# Patient Record
Sex: Male | Born: 1995 | Race: Black or African American | Hispanic: No | Marital: Single | Smoking: Current some day smoker
Health system: Southern US, Community
[De-identification: ages and names within clinical notes are randomized; demographics above are authoritative.]

## PROBLEM LIST (undated history)

## (undated) DIAGNOSIS — W3400XA Accidental discharge from unspecified firearms or gun, initial encounter: Secondary | ICD-10-CM

---

## 2017-09-03 ENCOUNTER — Encounter (HOSPITAL_BASED_OUTPATIENT_CLINIC_OR_DEPARTMENT_OTHER): Payer: Self-pay | Admitting: *Deleted

## 2017-09-03 ENCOUNTER — Other Ambulatory Visit: Payer: Self-pay

## 2017-09-03 ENCOUNTER — Emergency Department (HOSPITAL_BASED_OUTPATIENT_CLINIC_OR_DEPARTMENT_OTHER)
Admission: EM | Admit: 2017-09-03 | Discharge: 2017-09-03 | Disposition: A | Discharge status: Home / Self Care | Payer: Self-pay | Attending: Emergency Medicine | Admitting: Emergency Medicine

## 2017-09-03 ENCOUNTER — Emergency Department (HOSPITAL_BASED_OUTPATIENT_CLINIC_OR_DEPARTMENT_OTHER): Payer: Self-pay

## 2017-09-03 DIAGNOSIS — R0602 Shortness of breath: Secondary | ICD-10-CM | POA: Insufficient documentation

## 2017-09-03 DIAGNOSIS — Y999 Unspecified external cause status: Secondary | ICD-10-CM | POA: Insufficient documentation

## 2017-09-03 DIAGNOSIS — Y939 Activity, unspecified: Secondary | ICD-10-CM | POA: Insufficient documentation

## 2017-09-03 DIAGNOSIS — S0083XA Contusion of other part of head, initial encounter: Secondary | ICD-10-CM | POA: Insufficient documentation

## 2017-09-03 DIAGNOSIS — R1110 Vomiting, unspecified: Secondary | ICD-10-CM | POA: Insufficient documentation

## 2017-09-03 DIAGNOSIS — S20212A Contusion of left front wall of thorax, initial encounter: Secondary | ICD-10-CM | POA: Insufficient documentation

## 2017-09-03 DIAGNOSIS — Y929 Unspecified place or not applicable: Secondary | ICD-10-CM | POA: Insufficient documentation

## 2017-09-03 DIAGNOSIS — F17210 Nicotine dependence, cigarettes, uncomplicated: Secondary | ICD-10-CM | POA: Insufficient documentation

## 2017-09-03 MED ORDER — KETOROLAC TROMETHAMINE 30 MG/ML IJ SOLN
30.0000 mg | Freq: Once | INTRAMUSCULAR | Status: DC
  Filled 2017-09-03: qty 1

## 2017-09-03 MED ORDER — NAPROXEN 500 MG PO TABS: 500.0000 mg | ORAL_TABLET | Freq: Two times a day (BID) | ORAL | 0 refills | Status: DC

## 2017-09-03 NOTE — ED Notes (Signed)
Ice pack given at d/c home.  

## 2017-09-03 NOTE — ED Notes (Signed)
Patient transported to CT 
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
## 2018-05-17 ENCOUNTER — Encounter (HOSPITAL_BASED_OUTPATIENT_CLINIC_OR_DEPARTMENT_OTHER): Payer: Self-pay | Admitting: Emergency Medicine

## 2018-05-17 ENCOUNTER — Emergency Department (HOSPITAL_BASED_OUTPATIENT_CLINIC_OR_DEPARTMENT_OTHER)
Admission: EM | Admit: 2018-05-17 | Discharge: 2018-05-18 | Disposition: A | Discharge status: Home / Self Care | Payer: Self-pay | Attending: Emergency Medicine | Admitting: Emergency Medicine

## 2018-05-17 ENCOUNTER — Other Ambulatory Visit: Payer: Self-pay

## 2018-05-17 DIAGNOSIS — Z202 Contact with and (suspected) exposure to infections with a predominantly sexual mode of transmission: Secondary | ICD-10-CM

## 2018-05-17 DIAGNOSIS — F17210 Nicotine dependence, cigarettes, uncomplicated: Secondary | ICD-10-CM | POA: Insufficient documentation

## 2018-05-17 DIAGNOSIS — R55 Syncope and collapse: Secondary | ICD-10-CM

## 2018-05-17 MED ORDER — AZITHROMYCIN 250 MG PO TABS
1000.0000 mg | ORAL_TABLET | Freq: Once | ORAL | Status: AC
  Administered 2018-05-17: 1000 mg via ORAL
  Filled 2018-05-17: qty 4

## 2018-05-17 MED ORDER — METRONIDAZOLE 500 MG PO TABS
2000.0000 mg | ORAL_TABLET | Freq: Once | ORAL | Status: AC
  Administered 2018-05-17: 2000 mg via ORAL
  Filled 2018-05-17: qty 4

## 2018-05-17 MED ORDER — CEFTRIAXONE SODIUM 250 MG IJ SOLR
250.0000 mg | Freq: Once | INTRAMUSCULAR | Status: AC
  Administered 2018-05-17: 250 mg via INTRAMUSCULAR
  Filled 2018-05-17: qty 250

## 2018-05-17 NOTE — Discharge Instructions (Signed)
You have been treated today for gonorrhea, chlamydia and trichomonas.  I recommend that you avoid any sexual intercourse (anal, oral, vaginal) for 1 week after you and your partner have been treated.  We have also tested you for HIV and syphilis.  You will be contacted if these are positive.   To find a primary care or specialty doctor please call [phone] or [phone] to access "[facility] Find a Doctor Service."  You may also go on the [facility] website at [email or web address]  There are also multiple [facility], [facility], [facility] and [facility] practices throughout the [place] that are frequently accepting new patients. You may find a clinic that is close to your home and contact them.  [facility] -  [address] [place] [phone]   [facility] -  [address] [place] [id number] [phone]   [facility] [phone]  [place] [phone]
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
## 2018-05-17 NOTE — ED Provider Notes (Signed)
TIME SEEN: 11:41 PM  CHIEF COMPLAINT: STD check  HPI: Patient is a 22 year old male with no significant past medical history who presents to the emergency department for an STD check.  States that his girlfriend contacted him and stated that she tested positive for trichomonas.  He has no symptoms at this time.  No fever, abdominal pain, dysuria, hematuria, penile discharge, testicular pain or swelling.  He has had chlamydia in the past.  He states that he thinks his girlfriend stated that she was negative for "everything else".  ROS: See HPI Constitutional: no fever  Eyes: no drainage  ENT: no runny nose   Cardiovascular:  no chest pain  Resp: no SOB  GI: no vomiting GU: no dysuria Integumentary: no rash  Allergy: no hives  Musculoskeletal: no leg swelling  Neurological: no slurred speech ROS otherwise negative  PAST MEDICAL HISTORY/PAST SURGICAL HISTORY:  History reviewed. No pertinent past medical history.  MEDICATIONS:  Prior to Admission medications   Medication Sig Start Date End Date Taking? Authorizing Provider  naproxen (NAPROSYN) 500 MG tablet Take 1 tablet (500 mg total) by mouth 2 (two) times daily. 09/03/17   Horton, Mayer Maskerourtney F, MD    ALLERGIES:  No Known Allergies  SOCIAL HISTORY:  Social History   Tobacco Use  . Smoking status: Current Every Day Smoker  . Smokeless tobacco: Never Used  Substance Use Topics  . Alcohol use: Yes    Comment: occasional     FAMILY HISTORY: History reviewed. No pertinent family history.  EXAM: BP 123/85 (BP Location: Left Arm)   Pulse 69   Temp 98.4 F (36.9 C) (Oral)   Resp 18   Ht 6' (1.829 m)   Wt 72.6 kg   SpO2 98%   BMI 21.70 kg/m  CONSTITUTIONAL: Alert and oriented and responds appropriately to questions. Well-appearing; well-nourished HEAD: Normocephalic EYES: Conjunctivae clear, pupils appear equal, EOMI ENT: normal nose; moist mucous membranes NECK: Supple, no meningismus, no nuchal rigidity, no LAD   CARD: RRR; S1 and S2 appreciated; no murmurs, no clicks, no rubs, no gallops RESP: Normal chest excursion without splinting or tachypnea; breath sounds clear and equal bilaterally; no wheezes, no rhonchi, no rales, no hypoxia or respiratory distress, speaking full sentences ABD/GI: Normal bowel sounds; non-distended; soft, non-tender, no rebound, no guarding, no peritoneal signs, no hepatosplenomegaly GU:  Patient declines BACK:  The back appears normal and is non-tender to palpation, there is no CVA tenderness EXT: Normal ROM in all joints; non-tender to palpation; no edema; normal capillary refill; no cyanosis, no calf tenderness or swelling    SKIN: Normal color for age and race; warm; no rash NEURO: Moves all extremities equally PSYCH: The patient's mood and manner are appropriate. Grooming and personal hygiene are appropriate.  MEDICAL DECISION MAKING: Patient here for STD screening, treatment.  Girlfriend positive for trichomonas.  He is asymptomatic.  He agrees to HIV, syphilis testing today.  We will treat for gonorrhea, chlamydia and trichomonas today given we do not have access to girlfriends results.  Patient comfortable with this plan.  We will give him outpatient follow-up.  ED PROGRESS: Patient had brief syncopal event after receiving IM ceftriaxone.  No preceding symptoms.  No seizure-like activity.  EKG shows no arrhythmia, interval abnormalities or ischemia.  Blood glucose is normal.  Patient's urine shows no sign of infection other than some trace leukocytes and rare bacteria.  He has been monitored in the ED after has a syncopal event has been able to  ambulate, eat and drink and reports feeling better.  Will discharge home.   At this time, I do not feel there is any life-threatening condition present. I have reviewed and discussed all results (EKG, imaging, lab, urine as appropriate) and exam findings with patient/family. I have reviewed nursing notes and appropriate previous  records.  I feel the patient is safe to be discharged home without further emergent workup and can continue workup as an outpatient as needed. Discussed usual and customary return precautions. Patient/family verbalize understanding and are comfortable with this plan.  Outpatient follow-up has been provided if needed. All questions have been answered.      EKG Interpretation  Date/Time:  Saturday May 18 2018 00:02:39 EST Ventricular Rate:  68 PR Interval:    QRS Duration: 92 QT Interval:  377 QTC Calculation: 401 R Axis:   78 Text Interpretation:  Sinus rhythm No old tracing to compare Confirmed by Sevastian Witczak, Baxter Hire [id number]) on 05/18/2018 12:11:25 AM         Kord Monette, Layla Maw, DO 05/18/18 6045

## 2018-05-18 LAB — URINALYSIS, ROUTINE W REFLEX MICROSCOPIC
Bilirubin Urine: NEGATIVE
Glucose, UA: NEGATIVE mg/dL
Hgb urine dipstick: NEGATIVE
Ketones, ur: NEGATIVE mg/dL
Nitrite: NEGATIVE
Protein, ur: NEGATIVE mg/dL
Specific Gravity, Urine: 1.02 (ref 1.005–1.030)
pH: 6 (ref 5.0–8.0)

## 2018-05-18 LAB — URINALYSIS, MICROSCOPIC (REFLEX)

## 2018-05-18 LAB — CBG MONITORING, ED: Glucose-Capillary: 84 mg/dL (ref 70–99)

## 2018-05-18 NOTE — ED Notes (Signed)
20

## 2018-05-18 NOTE — ED Notes (Signed)
Blood sugar CBG 84

## 2018-05-19 LAB — RPR: RPR Ser Ql: NONREACTIVE

## 2018-05-19 LAB — HIV ANTIBODY (ROUTINE TESTING W REFLEX): HIV Screen 4th Generation wRfx: NONREACTIVE

## 2018-11-10 ENCOUNTER — Other Ambulatory Visit: Payer: Self-pay

## 2018-11-10 ENCOUNTER — Observation Stay (HOSPITAL_COMMUNITY): Payer: No Typology Code available for payment source

## 2018-11-10 ENCOUNTER — Emergency Department (HOSPITAL_COMMUNITY): Payer: No Typology Code available for payment source

## 2018-11-10 ENCOUNTER — Observation Stay (HOSPITAL_COMMUNITY)
Admission: EM | Admit: 2018-11-10 | Discharge: 2018-11-10 | Disposition: A | Discharge status: Home / Self Care | Payer: No Typology Code available for payment source | Attending: Emergency Medicine | Admitting: Emergency Medicine

## 2018-11-10 ENCOUNTER — Encounter (HOSPITAL_COMMUNITY): Payer: Self-pay | Admitting: Emergency Medicine

## 2018-11-10 DIAGNOSIS — F17200 Nicotine dependence, unspecified, uncomplicated: Secondary | ICD-10-CM | POA: Insufficient documentation

## 2018-11-10 DIAGNOSIS — S27339A Laceration of lung, unspecified, initial encounter: Secondary | ICD-10-CM | POA: Diagnosis present

## 2018-11-10 DIAGNOSIS — Y92410 Unspecified street and highway as the place of occurrence of the external cause: Secondary | ICD-10-CM | POA: Diagnosis not present

## 2018-11-10 DIAGNOSIS — Z1159 Encounter for screening for other viral diseases: Secondary | ICD-10-CM | POA: Diagnosis not present

## 2018-11-10 DIAGNOSIS — S82111A Displaced fracture of right tibial spine, initial encounter for closed fracture: Secondary | ICD-10-CM | POA: Diagnosis not present

## 2018-11-10 DIAGNOSIS — S270XXA Traumatic pneumothorax, initial encounter: Secondary | ICD-10-CM | POA: Diagnosis present

## 2018-11-10 DIAGNOSIS — M25561 Pain in right knee: Secondary | ICD-10-CM | POA: Insufficient documentation

## 2018-11-10 DIAGNOSIS — S27331A Laceration of lung, unilateral, initial encounter: Principal | ICD-10-CM | POA: Insufficient documentation

## 2018-11-10 DIAGNOSIS — S27322A Contusion of lung, bilateral, initial encounter: Secondary | ICD-10-CM

## 2018-11-10 DIAGNOSIS — S2243XA Multiple fractures of ribs, bilateral, initial encounter for closed fracture: Secondary | ICD-10-CM | POA: Diagnosis not present

## 2018-11-10 DIAGNOSIS — Z791 Long term (current) use of non-steroidal anti-inflammatories (NSAID): Secondary | ICD-10-CM | POA: Insufficient documentation

## 2018-11-10 LAB — URINALYSIS, ROUTINE W REFLEX MICROSCOPIC
Bacteria, UA: NONE SEEN
Bilirubin Urine: NEGATIVE
Glucose, UA: NEGATIVE mg/dL
Ketones, ur: NEGATIVE mg/dL
Leukocytes,Ua: NEGATIVE
Nitrite: NEGATIVE
Protein, ur: 100 mg/dL — AB
Specific Gravity, Urine: 1.029 (ref 1.005–1.030)
pH: 5 (ref 5.0–8.0)

## 2018-11-10 LAB — TROPONIN I: Troponin I: 0.06 ng/mL (ref <0.03)

## 2018-11-10 LAB — SARS CORONAVIRUS 2 BY RT PCR (HOSPITAL ORDER, PERFORMED IN ~~LOC~~ HOSPITAL LAB): SARS Coronavirus 2: NEGATIVE

## 2018-11-10 LAB — COMPREHENSIVE METABOLIC PANEL
ALT: 44 U/L (ref 0–44)
AST: 108 U/L — ABNORMAL HIGH (ref 15–41)
Albumin: 4.7 g/dL (ref 3.5–5.0)
Alkaline Phosphatase: 89 U/L (ref 38–126)
Anion gap: 14 (ref 5–15)
BUN: 18 mg/dL (ref 6–20)
CO2: 22 mmol/L (ref 22–32)
Calcium: 9.1 mg/dL (ref 8.9–10.3)
Chloride: 102 mmol/L (ref 98–111)
Creatinine, Ser: 1.53 mg/dL — ABNORMAL HIGH (ref 0.61–1.24)
GFR calc Af Amer: >60 mL/min (ref >60)
GFR calc non Af Amer: >60 mL/min (ref >60)
Glucose, Bld: 145 mg/dL — ABNORMAL HIGH (ref 70–99)
Potassium: 3.1 mmol/L — ABNORMAL LOW (ref 3.5–5.1)
Sodium: 138 mmol/L (ref 135–145)
Total Bilirubin: 0.8 mg/dL (ref 0.3–1.2)
Total Protein: 7 g/dL (ref 6.5–8.1)

## 2018-11-10 LAB — CBC
HCT: 41.6 % (ref 39.0–52.0)
Hemoglobin: 14.2 g/dL (ref 13.0–17.0)
MCH: 31.1 pg (ref 26.0–34.0)
MCHC: 34.1 g/dL (ref 30.0–36.0)
MCV: 91 fL (ref 80.0–100.0)
Platelets: 190 10*3/uL (ref 150–400)
RBC: 4.57 MIL/uL (ref 4.22–5.81)
RDW: 11.9 % (ref 11.5–15.5)
WBC: 10.5 10*3/uL (ref 4.0–10.5)
nRBC: 0 % (ref 0.0–0.2)

## 2018-11-10 LAB — PROTIME-INR
INR: 1.1 (ref 0.8–1.2)
Prothrombin Time: 13.8 seconds (ref 11.4–15.2)

## 2018-11-10 LAB — SAMPLE TO BLOOD BANK

## 2018-11-10 LAB — LACTIC ACID, PLASMA: Lactic Acid, Venous: 4.1 mmol/L (ref 0.5–1.9)

## 2018-11-10 LAB — ETHANOL: Alcohol, Ethyl (B): 123 mg/dL — ABNORMAL HIGH (ref <10)

## 2018-11-10 MED ORDER — SODIUM CHLORIDE 0.9 % IV BOLUS
1000.0000 mL | Freq: Once | INTRAVENOUS | Status: AC
  Administered 2018-11-10: 04:00:00 1000 mL via INTRAVENOUS

## 2018-11-10 MED ORDER — METOPROLOL TARTRATE 5 MG/5ML IV SOLN: 5.0000 mg | Freq: Four times a day (QID) | INTRAVENOUS | Status: DC | PRN

## 2018-11-10 MED ORDER — SODIUM CHLORIDE 0.9 % IV BOLUS
1000.0000 mL | Freq: Once | INTRAVENOUS | Status: AC
  Administered 2018-11-10: 1000 mL via INTRAVENOUS

## 2018-11-10 MED ORDER — HYDRALAZINE HCL 20 MG/ML IJ SOLN: 10.0000 mg | INTRAMUSCULAR | Status: DC | PRN

## 2018-11-10 MED ORDER — OXYCODONE HCL 5 MG PO TABS
5.0000 mg | ORAL_TABLET | ORAL | Status: DC | PRN
  Administered 2018-11-10: 5 mg via ORAL
  Filled 2018-11-10: qty 1

## 2018-11-10 MED ORDER — ACETAMINOPHEN 325 MG PO TABS: 650.0000 mg | ORAL_TABLET | ORAL | Status: DC | PRN

## 2018-11-10 MED ORDER — SODIUM CHLORIDE 0.9 % IV SOLN
INTRAVENOUS | Status: DC
  Administered 2018-11-10: 11:00:00 via INTRAVENOUS

## 2018-11-10 MED ORDER — MORPHINE SULFATE (PF) 2 MG/ML IV SOLN
2.0000 mg | INTRAVENOUS | Status: DC | PRN
  Administered 2018-11-10 (×2): 2 mg via INTRAVENOUS
  Filled 2018-11-10: qty 2

## 2018-11-10 MED ORDER — ONDANSETRON HCL 4 MG/2ML IJ SOLN
4.0000 mg | Freq: Once | INTRAMUSCULAR | Status: AC
  Administered 2018-11-10: 04:00:00 4 mg via INTRAVENOUS
  Filled 2018-11-10: qty 2

## 2018-11-10 MED ORDER — HYDROMORPHONE HCL 1 MG/ML IJ SOLN: 1.0000 mg | Freq: Once | INTRAMUSCULAR | Status: DC

## 2018-11-10 MED ORDER — LORAZEPAM 2 MG/ML IJ SOLN: 0.5000 mg | Freq: Once | INTRAMUSCULAR | Status: DC

## 2018-11-10 MED ORDER — FENTANYL CITRATE (PF) 100 MCG/2ML IJ SOLN
100.0000 ug | Freq: Once | INTRAMUSCULAR | Status: AC
  Administered 2018-11-10: 05:00:00 100 ug via INTRAVENOUS
  Filled 2018-11-10: qty 2

## 2018-11-10 MED ORDER — OXYCODONE HCL 5 MG PO TABS: 5.0000 mg | ORAL_TABLET | Freq: Four times a day (QID) | ORAL | 0 refills | Status: DC | PRN

## 2018-11-10 MED ORDER — IBUPROFEN 800 MG PO TABS: 800.0000 mg | ORAL_TABLET | Freq: Three times a day (TID) | ORAL | 0 refills | Status: AC | PRN

## 2018-11-10 MED ORDER — ONDANSETRON HCL 4 MG/2ML IJ SOLN: 4.0000 mg | Freq: Four times a day (QID) | INTRAMUSCULAR | Status: DC | PRN

## 2018-11-10 MED ORDER — ONDANSETRON 4 MG PO TBDP: 4.0000 mg | ORAL_TABLET | Freq: Four times a day (QID) | ORAL | Status: DC | PRN

## 2018-11-10 MED ORDER — IOHEXOL 300 MG/ML  SOLN
100.0000 mL | Freq: Once | INTRAMUSCULAR | Status: AC | PRN
  Administered 2018-11-10: 100 mL via INTRAVENOUS
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
## 2018-11-10 NOTE — ED Notes (Signed)
Update mom - Rodney Baker [phone]. Whenever possible.

## 2018-11-10 NOTE — ED Notes (Signed)
CRITICAL VALUE ALERT  Critical Value:  Lactic 4.1 Date & Time Notied:  11/10/2018 0351  Provider Notified: Rancour
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
## 2018-11-10 NOTE — Care Management (Signed)
3n1 and RW ordered to be delivered to room prior to d/c per therapy recommendations.
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
## 2018-11-10 NOTE — ED Notes (Signed)
Dr. Sheliah Hatch in to examine pt

## 2018-11-10 NOTE — ED Notes (Signed)
c collar placed on pt's neck on arrival to ED.
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
## 2018-11-10 NOTE — ED Notes (Signed)
Recollect blue top tube per main lab

## 2018-11-10 NOTE — ED Notes (Signed)
CRITICAL VALUE ALERT  Critical Value:  Trop 0.06 Date & Time Notied:  11/10/2018 at 6am Provider Notified: Rancour
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
## 2018-11-19 ENCOUNTER — Emergency Department (HOSPITAL_COMMUNITY)
Admission: EM | Admit: 2018-11-19 | Discharge: 2018-11-20 | Discharge status: Against Medical Advice | Payer: Self-pay | Attending: Emergency Medicine | Admitting: Emergency Medicine

## 2018-11-19 ENCOUNTER — Emergency Department (HOSPITAL_COMMUNITY): Payer: Self-pay

## 2018-11-19 ENCOUNTER — Other Ambulatory Visit: Payer: Self-pay

## 2018-11-19 DIAGNOSIS — Z5321 Procedure and treatment not carried out due to patient leaving prior to being seen by health care provider: Secondary | ICD-10-CM | POA: Insufficient documentation

## 2018-11-19 LAB — CBC
HCT: 41 % (ref 39.0–52.0)
Hemoglobin: 13.8 g/dL (ref 13.0–17.0)
MCH: 31.2 pg (ref 26.0–34.0)
MCHC: 33.7 g/dL (ref 30.0–36.0)
MCV: 92.8 fL (ref 80.0–100.0)
Platelets: 208 10*3/uL (ref 150–400)
RBC: 4.42 MIL/uL (ref 4.22–5.81)
RDW: 11.7 % (ref 11.5–15.5)
WBC: 8.3 10*3/uL (ref 4.0–10.5)
nRBC: 0 % (ref 0.0–0.2)

## 2018-11-19 LAB — BASIC METABOLIC PANEL
Anion gap: 10 (ref 5–15)
BUN: 19 mg/dL (ref 6–20)
CO2: 29 mmol/L (ref 22–32)
Calcium: 10 mg/dL (ref 8.9–10.3)
Chloride: 101 mmol/L (ref 98–111)
Creatinine, Ser: 1.39 mg/dL — ABNORMAL HIGH (ref 0.61–1.24)
GFR calc Af Amer: >60 mL/min (ref >60)
GFR calc non Af Amer: >60 mL/min (ref >60)
Glucose, Bld: 120 mg/dL — ABNORMAL HIGH (ref 70–99)
Potassium: 3.6 mmol/L (ref 3.5–5.1)
Sodium: 140 mmol/L (ref 135–145)
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
## 2018-11-20 ENCOUNTER — Other Ambulatory Visit: Payer: Self-pay

## 2018-11-20 ENCOUNTER — Encounter (HOSPITAL_COMMUNITY): Payer: Self-pay | Admitting: Emergency Medicine

## 2018-11-20 ENCOUNTER — Emergency Department (HOSPITAL_COMMUNITY): Payer: No Typology Code available for payment source

## 2018-11-20 ENCOUNTER — Emergency Department (HOSPITAL_COMMUNITY)
Admission: EM | Admit: 2018-11-20 | Discharge: 2018-11-20 | Disposition: A | Discharge status: Home / Self Care | Payer: No Typology Code available for payment source | Attending: Emergency Medicine | Admitting: Emergency Medicine

## 2018-11-20 DIAGNOSIS — S2242XD Multiple fractures of ribs, left side, subsequent encounter for fracture with routine healing: Secondary | ICD-10-CM | POA: Insufficient documentation

## 2018-11-20 DIAGNOSIS — M25562 Pain in left knee: Secondary | ICD-10-CM | POA: Insufficient documentation

## 2018-11-20 DIAGNOSIS — M25561 Pain in right knee: Secondary | ICD-10-CM | POA: Diagnosis not present

## 2018-11-20 DIAGNOSIS — R0789 Other chest pain: Secondary | ICD-10-CM

## 2018-11-20 DIAGNOSIS — F17200 Nicotine dependence, unspecified, uncomplicated: Secondary | ICD-10-CM | POA: Insufficient documentation

## 2018-11-20 MED ORDER — OXYCODONE-ACETAMINOPHEN 5-325 MG PO TABS
1.0000 | ORAL_TABLET | Freq: Once | ORAL | Status: AC
  Administered 2018-11-20: 13:00:00 1 via ORAL
  Filled 2018-11-20: qty 1

## 2018-11-20 MED ORDER — OXYCODONE-ACETAMINOPHEN 5-325 MG PO TABS: 1.0000 | ORAL_TABLET | Freq: Four times a day (QID) | ORAL | 0 refills | Status: DC | PRN
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
## 2018-11-20 NOTE — Discharge Instructions (Addendum)
Please read attached information. If you experience any new or worsening signs or symptoms please return to the emergency room for evaluation. Please follow-up with your primary care provider or specialist as discussed. Please use medication prescribed only as directed and discontinue taking if you have any concerning signs or symptoms.   °

## 2018-11-20 NOTE — ED Notes (Signed)
No answer
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
## 2018-12-25 ENCOUNTER — Other Ambulatory Visit: Payer: Self-pay

## 2018-12-25 ENCOUNTER — Encounter (HOSPITAL_COMMUNITY): Payer: Self-pay

## 2018-12-25 ENCOUNTER — Emergency Department (HOSPITAL_COMMUNITY): Payer: No Typology Code available for payment source

## 2018-12-25 ENCOUNTER — Emergency Department (HOSPITAL_COMMUNITY)
Admission: EM | Admit: 2018-12-25 | Discharge: 2018-12-25 | Disposition: A | Discharge status: Home / Self Care | Payer: No Typology Code available for payment source | Attending: Emergency Medicine | Admitting: Emergency Medicine

## 2018-12-25 DIAGNOSIS — Z79899 Other long term (current) drug therapy: Secondary | ICD-10-CM | POA: Diagnosis not present

## 2018-12-25 DIAGNOSIS — M7918 Myalgia, other site: Secondary | ICD-10-CM | POA: Diagnosis present

## 2018-12-25 DIAGNOSIS — F17210 Nicotine dependence, cigarettes, uncomplicated: Secondary | ICD-10-CM | POA: Insufficient documentation

## 2018-12-25 MED ORDER — METHOCARBAMOL 500 MG PO TABS: 500.0000 mg | ORAL_TABLET | Freq: Two times a day (BID) | ORAL | 0 refills | Status: DC

## 2018-12-25 MED ORDER — LIDOCAINE 5 % EX PTCH: 1.0000 | MEDICATED_PATCH | CUTANEOUS | 0 refills | Status: DC
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
## 2018-12-25 NOTE — Discharge Instructions (Addendum)
Expect your soreness to increase over the next 2-3 days. Take it easy, but do not lay around too much as this may make any stiffness worse.  Antiinflammatory medications: Take 600 mg of ibuprofen every 6 hours or 440 mg (over the counter dose) to 500 mg (prescription dose) of naproxen every 12 hours for the next 3 days. After this time, these medications may be used as needed for pain. Take these medications with food to avoid upset stomach. Choose only one of these medications, do not take them together. Acetaminophen (generic for Tylenol): Should you continue to have additional pain while taking the ibuprofen or naproxen, you may add in acetaminophen as needed. Your daily total maximum amount of acetaminophen from all sources should be limited to 4000mg /day for persons without liver problems, or 2000mg /day for those with liver problems. Methocarbamol: Methocarbamol (generic for Robaxin) is a muscle relaxer and can help relieve stiff muscles or muscle spasms.  Do not drive or perform other dangerous activities while taking this medication as it can cause drowsiness as well as changes in reaction time and judgement. Lidocaine patches: These are available via either prescription or over-the-counter. The over-the-counter option may be more economical one and are likely just as effective. There are multiple over-the-counter brands, such as Salonpas. Exercises: Be sure to perform the attached exercises starting with three times a week and working up to performing them daily. This is an essential part of preventing long term problems.  Follow up: Follow up with a primary care provider for any future management of these complaints. Be sure to follow up within 7-10 days. Return: Return to the ED should symptoms worsen.  For prescription assistance, may try using prescription discount sites or apps, such as goodrx.com

## 2020-07-07 ENCOUNTER — Other Ambulatory Visit: Payer: Self-pay

## 2020-07-07 ENCOUNTER — Emergency Department (HOSPITAL_COMMUNITY)
Admission: EM | Admit: 2020-07-07 | Discharge: 2020-07-08 | Discharge status: Against Medical Advice | Payer: No Typology Code available for payment source

## 2020-07-07 NOTE — ED Notes (Signed)
Pt called x 3  No answer. 

## 2020-07-08 ENCOUNTER — Emergency Department (HOSPITAL_BASED_OUTPATIENT_CLINIC_OR_DEPARTMENT_OTHER)
Admission: EM | Admit: 2020-07-08 | Discharge: 2020-07-08 | Disposition: A | Discharge status: Home / Self Care | Payer: Self-pay | Attending: Emergency Medicine | Admitting: Emergency Medicine

## 2020-07-08 ENCOUNTER — Encounter (HOSPITAL_BASED_OUTPATIENT_CLINIC_OR_DEPARTMENT_OTHER): Payer: Self-pay

## 2020-07-08 DIAGNOSIS — F17210 Nicotine dependence, cigarettes, uncomplicated: Secondary | ICD-10-CM | POA: Insufficient documentation

## 2020-07-08 DIAGNOSIS — L0291 Cutaneous abscess, unspecified: Secondary | ICD-10-CM

## 2020-07-08 DIAGNOSIS — L02411 Cutaneous abscess of right axilla: Secondary | ICD-10-CM | POA: Insufficient documentation

## 2020-07-08 MED ORDER — SULFAMETHOXAZOLE-TRIMETHOPRIM 800-160 MG PO TABS: 1.0000 | ORAL_TABLET | Freq: Two times a day (BID) | ORAL | 0 refills | Status: AC

## 2020-07-08 MED ORDER — OXYCODONE-ACETAMINOPHEN 5-325 MG PO TABS
1.0000 | ORAL_TABLET | Freq: Once | ORAL | Status: AC
  Administered 2020-07-08: 1 via ORAL
  Filled 2020-07-08: qty 1

## 2020-07-08 MED ORDER — OXYCODONE-ACETAMINOPHEN 5-325 MG PO TABS: 1.0000 | ORAL_TABLET | Freq: Three times a day (TID) | ORAL | 0 refills | Status: DC | PRN

## 2020-07-08 MED ORDER — LIDOCAINE HCL (PF) 1 % IJ SOLN
5.0000 mL | Freq: Once | INTRAMUSCULAR | Status: AC
  Administered 2020-07-08: 5 mL via INTRADERMAL
  Filled 2020-07-08: qty 5
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
## 2020-07-08 NOTE — Discharge Instructions (Addendum)
Keep the wound clean and dry and lightly covered to allow it to drain.  Get a warm soapy bath filled with Dial antibacterial soap and soak your armpit at least once a day.  Take the antibiotics as prescribed.  Use Tylenol Motrin as described below to maximize pain control.  Use the Percocet for breakthrough pain.  You can take 600 mg of ibuprofen every 6 hours, you can take 1000 mg of Tylenol every 6 hours, you can alternate these every 3 or you can take them together.

## 2020-07-08 NOTE — ED Notes (Signed)
Patient with abscess to right axilla, swollen, tender, no drainage.

## 2020-07-08 NOTE — ED Triage Notes (Signed)
Pt reports boil under R arm x5 days.
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
## 2021-06-26 ENCOUNTER — Encounter (HOSPITAL_BASED_OUTPATIENT_CLINIC_OR_DEPARTMENT_OTHER): Payer: Self-pay | Admitting: Emergency Medicine

## 2021-06-26 ENCOUNTER — Other Ambulatory Visit: Payer: Self-pay

## 2021-06-26 ENCOUNTER — Emergency Department (HOSPITAL_BASED_OUTPATIENT_CLINIC_OR_DEPARTMENT_OTHER)
Admission: EM | Admit: 2021-06-26 | Discharge: 2021-06-27 | Disposition: A | Discharge status: Home / Self Care | Payer: Self-pay | Attending: Emergency Medicine | Admitting: Emergency Medicine

## 2021-06-26 DIAGNOSIS — R112 Nausea with vomiting, unspecified: Secondary | ICD-10-CM | POA: Insufficient documentation

## 2021-06-26 DIAGNOSIS — R197 Diarrhea, unspecified: Secondary | ICD-10-CM | POA: Insufficient documentation

## 2021-06-26 DIAGNOSIS — R1031 Right lower quadrant pain: Secondary | ICD-10-CM | POA: Insufficient documentation

## 2021-06-26 LAB — CBC WITH DIFFERENTIAL/PLATELET
Abs Immature Granulocytes: 0.08 10*3/uL — ABNORMAL HIGH (ref 0.00–0.07)
Basophils Absolute: 0 10*3/uL (ref 0.0–0.1)
Basophils Relative: 0 %
Eosinophils Absolute: 0 10*3/uL (ref 0.0–0.5)
Eosinophils Relative: 0 %
HCT: 45.3 % (ref 39.0–52.0)
Hemoglobin: 15.9 g/dL (ref 13.0–17.0)
Immature Granulocytes: 0 %
Lymphocytes Relative: 4 %
Lymphs Abs: 0.7 10*3/uL (ref 0.7–4.0)
MCH: 32.2 pg (ref 26.0–34.0)
MCHC: 35.1 g/dL (ref 30.0–36.0)
MCV: 91.7 fL (ref 80.0–100.0)
Monocytes Absolute: 0.4 10*3/uL (ref 0.1–1.0)
Monocytes Relative: 2 %
Neutro Abs: 18.3 10*3/uL — ABNORMAL HIGH (ref 1.7–7.7)
Neutrophils Relative %: 94 %
Platelets: 192 10*3/uL (ref 150–400)
RBC: 4.94 MIL/uL (ref 4.22–5.81)
RDW: 11.6 % (ref 11.5–15.5)
WBC: 19.5 10*3/uL — ABNORMAL HIGH (ref 4.0–10.5)
nRBC: 0 % (ref 0.0–0.2)

## 2021-06-26 MED ORDER — ALUM & MAG HYDROXIDE-SIMETH 200-200-20 MG/5ML PO SUSP
30.0000 mL | Freq: Once | ORAL | Status: AC
  Administered 2021-06-26: 30 mL via ORAL
  Filled 2021-06-26: qty 30

## 2021-06-26 MED ORDER — ONDANSETRON HCL 4 MG/2ML IJ SOLN
4.0000 mg | Freq: Once | INTRAMUSCULAR | Status: AC
  Administered 2021-06-26: 4 mg via INTRAVENOUS
  Filled 2021-06-26: qty 2

## 2021-06-26 MED ORDER — LIDOCAINE VISCOUS HCL 2 % MT SOLN
15.0000 mL | Freq: Once | OROMUCOSAL | Status: AC
  Administered 2021-06-26: 15 mL via ORAL
  Filled 2021-06-26: qty 15

## 2021-06-26 MED ORDER — LACTATED RINGERS IV BOLUS
1000.0000 mL | Freq: Once | INTRAVENOUS | Status: AC
  Administered 2021-06-26: 1000 mL via INTRAVENOUS

## 2021-06-26 NOTE — ED Provider Notes (Signed)
[facility] EMERGENCY DEPARTMENT Provider Note   CSN: [id number] Arrival date & time: 06/26/21  2252     History  Chief Complaint  Patient presents with   Vomiting    Rodney Baker is a 26 y.o. male.  Patient presents with vomiting and diarrhea after drinking alcohol heavily last night.  Reports he drank "1/5 of Hennessy". He felt well prior to this.  States he has been having vomiting all day long about 10 times and that has been nonbilious and nonbloody.  Having 3 loose stools as well.  Stools have been nonbloody.  Denies abdominal pain.  Denies fever.  Denies chest pain or shortness of breath.  Denies any travel or sick contacts.  States before last night he has not had any alcohol for greater than 1 month.  Does use tobacco and marijuana on a regular basis.  No other drug use.  No chest pain or shortness of breath.  Does have a sore throat from vomiting. No previous history of GI issues.  The history is provided by the patient.      Home Medications Prior to Admission medications   Medication Sig Start Date End Date Taking? Authorizing Provider  ibuprofen (ADVIL) 800 MG tablet Take 1 tablet (800 mg total) by mouth every 8 (eight) hours as needed. 11/10/18   Kinsinger, Arta Bruce, MD  lidocaine (LIDODERM) 5 % Place 1 patch onto the skin daily. Remove & Discard patch within 12 hours or as directed by MD 12/25/18   Joy, Shawn C, PA-C  methocarbamol (ROBAXIN) 500 MG tablet Take 1 tablet (500 mg total) by mouth 2 (two) times daily. 12/25/18   Joy, Shawn C, PA-C  oxyCODONE-acetaminophen (PERCOCET/ROXICET) 5-325 MG tablet Take 1 tablet by mouth every 6 (six) hours as needed. 11/20/18   Hedges, Dellis Filbert, PA-C  oxyCODONE-acetaminophen (PERCOCET/ROXICET) 5-325 MG tablet Take 1 tablet by mouth every 8 (eight) hours as needed for up to 9 doses for severe pain. 07/08/20   Breck Coons, MD      Allergies    Patient has no known allergies.    Review of Systems   Review of Systems   Constitutional:  Positive for activity change and appetite change. Negative for fever.  HENT:  Negative for congestion and rhinorrhea.   Respiratory:  Negative for cough, chest tightness and shortness of breath.   Cardiovascular:  Negative for chest pain.  Gastrointestinal:  Positive for diarrhea, nausea and vomiting. Negative for abdominal pain.  Genitourinary:  Negative for dysuria and hematuria.  Musculoskeletal:  Negative for arthralgias and myalgias.  Skin:  Negative for rash.  Neurological:  Negative for dizziness, weakness and headaches.   all other systems are negative except as noted in the HPI and PMH.   Physical Exam Updated Vital Signs BP 133/84 (BP Location: Right Arm)    Pulse 70    Temp 97.6 F (36.4 C) (Oral)    Resp 18    Ht 6' (1.829 m)    Wt 74.8 kg    SpO2 100%    BMI 22.38 kg/m  Physical Exam Vitals and nursing note reviewed.  Constitutional:      General: He is not in acute distress.    Appearance: Normal appearance. He is well-developed and normal weight. He is not ill-appearing.  HENT:     Head: Normocephalic and atraumatic.     Mouth/Throat:     Pharynx: No oropharyngeal exudate.  Eyes:     Conjunctiva/sclera: Conjunctivae normal.  Pupils: Pupils are equal, round, and reactive to light.  Neck:     Comments: No meningismus. Cardiovascular:     Rate and Rhythm: Normal rate and regular rhythm.     Heart sounds: Normal heart sounds. No murmur heard. Pulmonary:     Effort: Pulmonary effort is normal. No respiratory distress.     Breath sounds: Normal breath sounds.  Abdominal:     Palpations: Abdomen is soft.     Tenderness: There is abdominal tenderness. There is no guarding or rebound.     Comments: Mild diffuse tenderness  Musculoskeletal:        General: No tenderness. Normal range of motion.     Cervical back: Normal range of motion and neck supple.  Skin:    General: Skin is warm.  Neurological:     General: No focal deficit present.      Mental Status: He is alert and oriented to person, place, and time. Mental status is at baseline.     Cranial Nerves: No cranial nerve deficit.     Motor: No abnormal muscle tone.     Coordination: Coordination normal.     Comments:  5/5 strength throughout. CN 2-12 intact.Equal grip strength.   Psychiatric:        Behavior: Behavior normal.    ED Results / Procedures / Treatments   Labs (all labs ordered are listed, but only abnormal results are displayed) Labs Reviewed  URINALYSIS, ROUTINE W REFLEX MICROSCOPIC - Abnormal; Notable for the following components:      Result Value   Glucose, UA 250 (*)    Ketones, ur 15 (*)    All other components within normal limits  CBC WITH DIFFERENTIAL/PLATELET - Abnormal; Notable for the following components:   WBC 19.5 (*)    Neutro Abs 18.3 (*)    Abs Immature Granulocytes 0.08 (*)    All other components within normal limits  COMPREHENSIVE METABOLIC PANEL - Abnormal; Notable for the following components:   BUN 23 (*)    Creatinine, Ser 1.31 (*)    Total Protein 8.7 (*)    Albumin 5.4 (*)    Anion gap 16 (*)    All other components within normal limits  LIPASE, BLOOD    EKG None  Radiology CT ABDOMEN PELVIS W CONTRAST  Result Date: 06/27/2021 CLINICAL DATA:  Nausea vomiting. Right lower quadrant abdominal pain. EXAM: CT ABDOMEN AND PELVIS WITH CONTRAST TECHNIQUE: Multidetector CT imaging of the abdomen and pelvis was performed using the standard protocol following bolus administration of intravenous contrast. CONTRAST:  138mL OMNIPAQUE IOHEXOL 300 MG/ML  SOLN COMPARISON:  None. FINDINGS: Lower chest: The visualized lung bases are clear. No intra-abdominal free air or free fluid. Hepatobiliary: No focal liver abnormality is seen. No gallstones, gallbladder wall thickening, or biliary dilatation. Pancreas: Unremarkable. No pancreatic ductal dilatation or surrounding inflammatory changes. Spleen: Normal in size without focal abnormality.  Adrenals/Urinary Tract: Adrenal glands are unremarkable. Kidneys are normal, without renal calculi, focal lesion, or hydronephrosis. Bladder is unremarkable. Stomach/Bowel: Several normal caliber fluid-filled loops of small bowel may be physiologic or represent mild enteritis. Clinical correlation is recommended. There is no bowel obstruction. The appendix is normal. Vascular/Lymphatic: The abdominal aorta and IVC unremarkable. No portal venous gas. There is no adenopathy. Reproductive: The prostate and seminal vesicles are grossly unremarkable. No pelvic mass. Other: None Musculoskeletal: No acute or significant osseous findings. IMPRESSION: Several normal caliber fluid-filled loops of small bowel may be physiologic or represent mild enteritis. No  bowel obstruction. Normal appendix. Electronically Signed   By: Anner Crete M.D.   On: 06/27/2021 00:28    Procedures Procedures    Medications Ordered in ED Medications  lactated ringers bolus 1,000 mL (has no administration in time range)  ondansetron (ZOFRAN) injection 4 mg (has no administration in time range)  alum & mag hydroxide-simeth (MAALOX/MYLANTA) 200-200-20 MG/5ML suspension 30 mL (has no administration in time range)    And  lidocaine (XYLOCAINE) 2 % viscous mouth solution 15 mL (has no administration in time range)    ED Course/ Medical Decision Making/ A&P                           Medical Decision Making Vomiting and diarrhea since drinking alcohol heavily last night.  Vital stable, no distress, abdomen soft without peritoneal signs.  Patient given fluids and antiemetics. WBC 19, unclear etiology. No significant abdominal pain.  Creatinine at baseline.  With significant leukocytosis and vomiting imaging is obtained.  Low suspicion however for cholecystitis or appendicitis or bowel obstruction.  CT scan shows evidence of enteritis without other acute pathology.  Appendix is normal.  Patient tolerating p.o. in the ED without  difficulty.  Discussed likely viral gastroenteritis versus alcoholic gastritis however that would not cause diarrhea.  Leukocytosis discussed with him.  Possibly early appendicitis discussed with patient and significant other. Discussed that appendicitis does not seem likely at this time however.  Discussed clear liquid diet over the next 24 hours, advance diet slowly.  Return to the ED with worsening pain especially to the right lower abdomen, fever, vomiting, any other concerns.        Final Clinical Impression(s) / ED Diagnoses Final diagnoses:  Nausea vomiting and diarrhea    Rx / DC Orders ED Discharge Orders     None         Jeneane Pieczynski, Annie Main, MD 06/27/21 [id number]
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
## 2021-06-26 NOTE — ED Notes (Signed)
Pt has been consuming personal drink at bedside since arrival to ED. Pt is tolerating PO intake without emesis episode.

## 2021-06-27 ENCOUNTER — Emergency Department (HOSPITAL_BASED_OUTPATIENT_CLINIC_OR_DEPARTMENT_OTHER): Payer: Self-pay

## 2021-06-27 LAB — COMPREHENSIVE METABOLIC PANEL
ALT: 14 U/L (ref 0–44)
AST: 36 U/L (ref 15–41)
Albumin: 5.4 g/dL — ABNORMAL HIGH (ref 3.5–5.0)
Alkaline Phosphatase: 101 U/L (ref 38–126)
Anion gap: 16 — ABNORMAL HIGH (ref 5–15)
BUN: 23 mg/dL — ABNORMAL HIGH (ref 6–20)
CO2: 23 mmol/L (ref 22–32)
Calcium: 9.8 mg/dL (ref 8.9–10.3)
Chloride: 101 mmol/L (ref 98–111)
Creatinine, Ser: 1.31 mg/dL — ABNORMAL HIGH (ref 0.61–1.24)
GFR, Estimated: >60 mL/min (ref >60)
Glucose, Bld: 98 mg/dL (ref 70–99)
Potassium: 4.6 mmol/L (ref 3.5–5.1)
Sodium: 140 mmol/L (ref 135–145)
Total Bilirubin: 1 mg/dL (ref 0.3–1.2)
Total Protein: 8.7 g/dL — ABNORMAL HIGH (ref 6.5–8.1)

## 2021-06-27 LAB — URINALYSIS, ROUTINE W REFLEX MICROSCOPIC
Bilirubin Urine: NEGATIVE
Glucose, UA: 250 mg/dL — AB
Hgb urine dipstick: NEGATIVE
Ketones, ur: 15 mg/dL — AB
Leukocytes,Ua: NEGATIVE
Nitrite: NEGATIVE
Protein, ur: NEGATIVE mg/dL
Specific Gravity, Urine: 1.01 (ref 1.005–1.030)
pH: 5.5 (ref 5.0–8.0)

## 2021-06-27 LAB — LIPASE, BLOOD: Lipase: 23 U/L (ref 11–51)

## 2021-06-27 MED ORDER — PROMETHAZINE HCL 25 MG/ML IJ SOLN
INTRAMUSCULAR | Status: AC
  Filled 2021-06-27: qty 1

## 2021-06-27 MED ORDER — ONDANSETRON 4 MG PO TBDP: 4.0000 mg | ORAL_TABLET | Freq: Three times a day (TID) | ORAL | 0 refills | Status: DC | PRN

## 2021-06-27 MED ORDER — SODIUM CHLORIDE 0.9 % IV SOLN
12.5000 mg | Freq: Once | INTRAVENOUS | Status: AC
  Administered 2021-06-27: 12.5 mg via INTRAVENOUS
  Filled 2021-06-27: qty 0.5

## 2021-06-27 MED ORDER — IOHEXOL 300 MG/ML  SOLN
100.0000 mL | Freq: Once | INTRAMUSCULAR | Status: AC | PRN
  Administered 2021-06-27: 100 mL via INTRAVENOUS

## 2021-06-27 MED ORDER — FAMOTIDINE IN NACL 20-0.9 MG/50ML-% IV SOLN
20.0000 mg | Freq: Once | INTRAVENOUS | Status: AC
  Administered 2021-06-27: 20 mg via INTRAVENOUS
  Filled 2021-06-27: qty 50

## 2021-06-27 NOTE — Discharge Instructions (Signed)
Your testing today is reassuring but does show an elevated white blood cell count which is a nonspecific marker of inflammation.  This is likely from a viral infection causing your vomiting and diarrhea.  However as we discussed that sometimes appendicitis does not show up right away on CT scan.  You should return to the ED with worsening pain especially to your right lower abdomen, persistent vomiting, not able to eat or drink, fever, any other concerns

## 2021-06-27 NOTE — ED Notes (Signed)
Patient transported to CT 

## 2022-10-29 ENCOUNTER — Observation Stay (HOSPITAL_COMMUNITY)
Admission: EM | Admit: 2022-10-29 | Discharge: 2022-10-30 | Disposition: A | Discharge status: Home / Self Care | Payer: 59 | Attending: Urology | Admitting: Urology

## 2022-10-29 ENCOUNTER — Encounter (HOSPITAL_COMMUNITY)
Admission: EM | Disposition: A | Discharge status: Home / Self Care | Payer: Self-pay | Source: Home / Self Care | Attending: Emergency Medicine

## 2022-10-29 ENCOUNTER — Encounter (HOSPITAL_COMMUNITY): Payer: Self-pay | Admitting: Emergency Medicine

## 2022-10-29 ENCOUNTER — Emergency Department (HOSPITAL_BASED_OUTPATIENT_CLINIC_OR_DEPARTMENT_OTHER): Payer: 59 | Admitting: Registered Nurse

## 2022-10-29 ENCOUNTER — Other Ambulatory Visit: Payer: Self-pay

## 2022-10-29 ENCOUNTER — Emergency Department (HOSPITAL_COMMUNITY): Payer: 59 | Admitting: Registered Nurse

## 2022-10-29 ENCOUNTER — Emergency Department (HOSPITAL_COMMUNITY): Payer: 59

## 2022-10-29 DIAGNOSIS — Z79899 Other long term (current) drug therapy: Secondary | ICD-10-CM | POA: Insufficient documentation

## 2022-10-29 DIAGNOSIS — S71101A Unspecified open wound, right thigh, initial encounter: Secondary | ICD-10-CM | POA: Diagnosis not present

## 2022-10-29 DIAGNOSIS — S3121XA Laceration without foreign body of penis, initial encounter: Secondary | ICD-10-CM | POA: Diagnosis not present

## 2022-10-29 DIAGNOSIS — S71111A Laceration without foreign body, right thigh, initial encounter: Secondary | ICD-10-CM | POA: Diagnosis not present

## 2022-10-29 DIAGNOSIS — S3133XA Puncture wound without foreign body of scrotum and testes, initial encounter: Secondary | ICD-10-CM | POA: Diagnosis not present

## 2022-10-29 DIAGNOSIS — F17210 Nicotine dependence, cigarettes, uncomplicated: Secondary | ICD-10-CM

## 2022-10-29 DIAGNOSIS — S3130XA Unspecified open wound of scrotum and testes, initial encounter: Principal | ICD-10-CM | POA: Insufficient documentation

## 2022-10-29 DIAGNOSIS — W3400XA Accidental discharge from unspecified firearms or gun, initial encounter: Secondary | ICD-10-CM

## 2022-10-29 DIAGNOSIS — Z9889 Other specified postprocedural states: Secondary | ICD-10-CM

## 2022-10-29 DIAGNOSIS — S3120XA Unspecified open wound of penis, initial encounter: Secondary | ICD-10-CM | POA: Diagnosis not present

## 2022-10-29 DIAGNOSIS — S71051A Open bite, right hip, initial encounter: Secondary | ICD-10-CM | POA: Diagnosis not present

## 2022-10-29 DIAGNOSIS — R600 Localized edema: Secondary | ICD-10-CM | POA: Diagnosis not present

## 2022-10-29 DIAGNOSIS — S3123XA Puncture wound without foreign body of penis, initial encounter: Secondary | ICD-10-CM | POA: Diagnosis not present

## 2022-10-29 DIAGNOSIS — S39848A Other specified injuries of external genitals, initial encounter: Secondary | ICD-10-CM | POA: Diagnosis not present

## 2022-10-29 HISTORY — PX: CYSTOSCOPY: SHX5120

## 2022-10-29 HISTORY — PX: SCROTAL EXPLORATION: SHX2386

## 2022-10-29 LAB — COMPREHENSIVE METABOLIC PANEL
ALT: 10 U/L (ref 0–44)
AST: 29 U/L (ref 15–41)
Albumin: 4.9 g/dL (ref 3.5–5.0)
Alkaline Phosphatase: 86 U/L (ref 38–126)
Anion gap: 17 — ABNORMAL HIGH (ref 5–15)
BUN: 14 mg/dL (ref 6–20)
CO2: 20 mmol/L — ABNORMAL LOW (ref 22–32)
Calcium: 9.5 mg/dL (ref 8.9–10.3)
Chloride: 100 mmol/L (ref 98–111)
Creatinine, Ser: 1.41 mg/dL — ABNORMAL HIGH (ref 0.61–1.24)
GFR, Estimated: >60 mL/min (ref >60)
Glucose, Bld: 135 mg/dL — ABNORMAL HIGH (ref 70–99)
Potassium: 3.2 mmol/L — ABNORMAL LOW (ref 3.5–5.1)
Sodium: 137 mmol/L (ref 135–145)
Total Bilirubin: 1.1 mg/dL (ref 0.3–1.2)
Total Protein: 7.7 g/dL (ref 6.5–8.1)

## 2022-10-29 LAB — I-STAT CHEM 8, ED
BUN: 14 mg/dL (ref 6–20)
Calcium, Ion: 1.08 mmol/L — ABNORMAL LOW (ref 1.15–1.40)
Chloride: 104 mmol/L (ref 98–111)
Creatinine, Ser: 1.5 mg/dL — ABNORMAL HIGH (ref 0.61–1.24)
Glucose, Bld: 130 mg/dL — ABNORMAL HIGH (ref 70–99)
HCT: 45 % (ref 39.0–52.0)
Hemoglobin: 15.3 g/dL (ref 13.0–17.0)
Potassium: 3.3 mmol/L — ABNORMAL LOW (ref 3.5–5.1)
Sodium: 140 mmol/L (ref 135–145)
TCO2: 21 mmol/L — ABNORMAL LOW (ref 22–32)

## 2022-10-29 LAB — CBC
HCT: 43 % (ref 39.0–52.0)
Hemoglobin: 14.5 g/dL (ref 13.0–17.0)
MCH: 30.5 pg (ref 26.0–34.0)
MCHC: 33.7 g/dL (ref 30.0–36.0)
MCV: 90.3 fL (ref 80.0–100.0)
Platelets: 201 10*3/uL (ref 150–400)
RBC: 4.76 MIL/uL (ref 4.22–5.81)
RDW: 11.9 % (ref 11.5–15.5)
WBC: 10.8 10*3/uL — ABNORMAL HIGH (ref 4.0–10.5)
nRBC: 0 % (ref 0.0–0.2)

## 2022-10-29 LAB — SAMPLE TO BLOOD BANK

## 2022-10-29 LAB — PROTIME-INR
INR: 1 (ref 0.8–1.2)
Prothrombin Time: 13.5 seconds (ref 11.4–15.2)

## 2022-10-29 LAB — LACTIC ACID, PLASMA: Lactic Acid, Venous: 5.9 mmol/L (ref 0.5–1.9)

## 2022-10-29 LAB — HIV ANTIBODY (ROUTINE TESTING W REFLEX): HIV Screen 4th Generation wRfx: NONREACTIVE

## 2022-10-29 LAB — ETHANOL: Alcohol, Ethyl (B): 97 mg/dL — ABNORMAL HIGH (ref <10)

## 2022-10-29 SURGERY — EXPLORATION, SCROTUM
Anesthesia: General

## 2022-10-29 MED ORDER — ROCURONIUM BROMIDE 10 MG/ML (PF) SYRINGE
PREFILLED_SYRINGE | INTRAVENOUS | Status: DC | PRN
  Administered 2022-10-29: 20 mg via INTRAVENOUS

## 2022-10-29 MED ORDER — LIDOCAINE 2% (20 MG/ML) 5 ML SYRINGE
INTRAMUSCULAR | Status: DC | PRN
  Administered 2022-10-29: 60 mg via INTRAVENOUS

## 2022-10-29 MED ORDER — SENNA 8.6 MG PO TABS
1.0000 | ORAL_TABLET | Freq: Two times a day (BID) | ORAL | Status: DC
  Administered 2022-10-29 – 2022-10-30 (×3): 8.6 mg via ORAL
  Filled 2022-10-29 (×3): qty 1

## 2022-10-29 MED ORDER — OXYCODONE HCL 5 MG PO TABS
ORAL_TABLET | ORAL | Status: AC
  Filled 2022-10-29: qty 1

## 2022-10-29 MED ORDER — FENTANYL CITRATE (PF) 100 MCG/2ML IJ SOLN
INTRAMUSCULAR | Status: AC
  Filled 2022-10-29: qty 2

## 2022-10-29 MED ORDER — FENTANYL CITRATE (PF) 100 MCG/2ML IJ SOLN
25.0000 ug | INTRAMUSCULAR | Status: DC | PRN
  Administered 2022-10-29 (×2): 50 ug via INTRAVENOUS

## 2022-10-29 MED ORDER — FENTANYL CITRATE PF 50 MCG/ML IJ SOSY
PREFILLED_SYRINGE | INTRAMUSCULAR | Status: AC
  Filled 2022-10-29: qty 1

## 2022-10-29 MED ORDER — ACETAMINOPHEN 325 MG PO TABS
650.0000 mg | ORAL_TABLET | ORAL | Status: DC | PRN
  Administered 2022-10-29: 650 mg via ORAL
  Filled 2022-10-29: qty 2

## 2022-10-29 MED ORDER — SODIUM CHLORIDE 0.9 % IV BOLUS (SEPSIS)
1000.0000 mL | Freq: Once | INTRAVENOUS | Status: AC
  Administered 2022-10-29: 1000 mL via INTRAVENOUS

## 2022-10-29 MED ORDER — SODIUM CHLORIDE 0.9 % IV SOLN: 2.0000 g | Freq: Once | INTRAVENOUS | Status: DC

## 2022-10-29 MED ORDER — MIDAZOLAM HCL 2 MG/2ML IJ SOLN
INTRAMUSCULAR | Status: AC
  Filled 2022-10-29: qty 2

## 2022-10-29 MED ORDER — FENTANYL CITRATE PF 50 MCG/ML IJ SOSY
75.0000 ug | PREFILLED_SYRINGE | Freq: Once | INTRAMUSCULAR | Status: AC
  Administered 2022-10-29: 75 ug via INTRAVENOUS

## 2022-10-29 MED ORDER — OXYCODONE HCL 5 MG PO TABS
5.0000 mg | ORAL_TABLET | ORAL | Status: DC | PRN
  Administered 2022-10-29 – 2022-10-30 (×3): 5 mg via ORAL
  Filled 2022-10-29 (×3): qty 1

## 2022-10-29 MED ORDER — LACTATED RINGERS IV SOLN: INTRAVENOUS | Status: DC | PRN

## 2022-10-29 MED ORDER — SODIUM CHLORIDE 0.9 % IV SOLN
1000.0000 mL | INTRAVENOUS | Status: DC
  Administered 2022-10-29: 1000 mL via INTRAVENOUS

## 2022-10-29 MED ORDER — CIPROFLOXACIN IN D5W 400 MG/200ML IV SOLN: 400.0000 mg | Freq: Two times a day (BID) | INTRAVENOUS | Status: DC

## 2022-10-29 MED ORDER — SODIUM CHLORIDE 0.9% FLUSH: 3.0000 mL | INTRAVENOUS | Status: DC | PRN

## 2022-10-29 MED ORDER — DOCUSATE SODIUM 100 MG PO CAPS
100.0000 mg | ORAL_CAPSULE | Freq: Two times a day (BID) | ORAL | Status: DC
  Administered 2022-10-29 – 2022-10-30 (×2): 100 mg via ORAL
  Filled 2022-10-29 (×3): qty 1

## 2022-10-29 MED ORDER — PROPOFOL 10 MG/ML IV BOLUS
INTRAVENOUS | Status: DC | PRN
  Administered 2022-10-29: 100 mg via INTRAVENOUS
  Administered 2022-10-29 (×2): 50 mg via INTRAVENOUS

## 2022-10-29 MED ORDER — IOHEXOL 350 MG/ML SOLN
75.0000 mL | Freq: Once | INTRAVENOUS | Status: AC | PRN
  Administered 2022-10-29: 75 mL via INTRAVENOUS

## 2022-10-29 MED ORDER — ONDANSETRON HCL 4 MG/2ML IJ SOLN: 4.0000 mg | INTRAMUSCULAR | Status: DC | PRN

## 2022-10-29 MED ORDER — DEXAMETHASONE SODIUM PHOSPHATE 10 MG/ML IJ SOLN
INTRAMUSCULAR | Status: DC | PRN
  Administered 2022-10-29: 5 mg via INTRAVENOUS

## 2022-10-29 MED ORDER — KETOROLAC TROMETHAMINE 30 MG/ML IJ SOLN
INTRAMUSCULAR | Status: AC
  Filled 2022-10-29: qty 1

## 2022-10-29 MED ORDER — ONDANSETRON HCL 4 MG/2ML IJ SOLN
INTRAMUSCULAR | Status: DC | PRN
  Administered 2022-10-29: 4 mg via INTRAVENOUS

## 2022-10-29 MED ORDER — FENTANYL CITRATE (PF) 250 MCG/5ML IJ SOLN
INTRAMUSCULAR | Status: AC
  Filled 2022-10-29: qty 5

## 2022-10-29 MED ORDER — SUCCINYLCHOLINE CHLORIDE 200 MG/10ML IV SOSY
PREFILLED_SYRINGE | INTRAVENOUS | Status: DC | PRN
  Administered 2022-10-29: 80 mg via INTRAVENOUS

## 2022-10-29 MED ORDER — PROPOFOL 10 MG/ML IV BOLUS: INTRAVENOUS | Status: DC | PRN

## 2022-10-29 MED ORDER — PROMETHAZINE HCL 25 MG/ML IJ SOLN: 6.2500 mg | INTRAMUSCULAR | Status: DC | PRN

## 2022-10-29 MED ORDER — SODIUM CHLORIDE 0.9 % IV SOLN: 250.0000 mL | INTRAVENOUS | Status: DC | PRN

## 2022-10-29 MED ORDER — CEFAZOLIN SODIUM-DEXTROSE 2-3 GM-%(50ML) IV SOLR
INTRAVENOUS | Status: DC | PRN
  Administered 2022-10-29: 2 g via INTRAVENOUS

## 2022-10-29 MED ORDER — PROPOFOL 10 MG/ML IV BOLUS
INTRAVENOUS | Status: AC
  Filled 2022-10-29: qty 20

## 2022-10-29 MED ORDER — HYDROMORPHONE HCL 1 MG/ML IJ SOLN
0.5000 mg | INTRAMUSCULAR | Status: DC | PRN
  Administered 2022-10-29: 0.5 mg via INTRAVENOUS
  Filled 2022-10-29: qty 1

## 2022-10-29 MED ORDER — AMISULPRIDE (ANTIEMETIC) 5 MG/2ML IV SOLN: 10.0000 mg | Freq: Once | INTRAVENOUS | Status: DC | PRN

## 2022-10-29 MED ORDER — CEFAZOLIN SODIUM-DEXTROSE 2-4 GM/100ML-% IV SOLN
2.0000 g | Freq: Once | INTRAVENOUS | Status: AC
  Administered 2022-10-29: 2 g via INTRAVENOUS

## 2022-10-29 MED ORDER — DEXMEDETOMIDINE HCL IN NACL 80 MCG/20ML IV SOLN
INTRAVENOUS | Status: DC | PRN
  Administered 2022-10-29: 8 ug via INTRAVENOUS

## 2022-10-29 MED ORDER — SUGAMMADEX SODIUM 200 MG/2ML IV SOLN
INTRAVENOUS | Status: DC | PRN
  Administered 2022-10-29: 100 mg via INTRAVENOUS
  Administered 2022-10-29: 50 mg via INTRAVENOUS

## 2022-10-29 MED ORDER — HYDROCODONE-ACETAMINOPHEN 7.5-325 MG PO TABS: 1.0000 | ORAL_TABLET | Freq: Four times a day (QID) | ORAL | 0 refills | Status: AC | PRN

## 2022-10-29 MED ORDER — MORPHINE SULFATE (PF) 2 MG/ML IV SOLN
2.0000 mg | INTRAVENOUS | Status: DC | PRN
  Administered 2022-10-30: 2 mg via INTRAVENOUS
  Filled 2022-10-29: qty 2
  Filled 2022-10-29: qty 1

## 2022-10-29 MED ORDER — MIDAZOLAM HCL 2 MG/2ML IJ SOLN
INTRAMUSCULAR | Status: DC | PRN
  Administered 2022-10-29: 2 mg via INTRAVENOUS

## 2022-10-29 MED ORDER — FENTANYL CITRATE (PF) 250 MCG/5ML IJ SOLN
INTRAMUSCULAR | Status: DC | PRN
  Administered 2022-10-29: 150 ug via INTRAVENOUS
  Administered 2022-10-29: 50 ug via INTRAVENOUS
  Administered 2022-10-29: 25 ug via INTRAVENOUS
  Administered 2022-10-29 (×2): 50 ug via INTRAVENOUS

## 2022-10-29 MED ORDER — OXYCODONE HCL 5 MG/5ML PO SOLN: 5.0000 mg | Freq: Once | ORAL | Status: AC | PRN

## 2022-10-29 MED ORDER — ACETAMINOPHEN 10 MG/ML IV SOLN: 1000.0000 mg | Freq: Once | INTRAVENOUS | Status: DC | PRN

## 2022-10-29 MED ORDER — KETOROLAC TROMETHAMINE 30 MG/ML IJ SOLN
30.0000 mg | Freq: Once | INTRAMUSCULAR | Status: AC | PRN
  Administered 2022-10-29: 30 mg via INTRAVENOUS

## 2022-10-29 MED ORDER — CEFAZOLIN SODIUM-DEXTROSE 2-4 GM/100ML-% IV SOLN
2.0000 g | Freq: Three times a day (TID) | INTRAVENOUS | Status: AC
  Administered 2022-10-29 (×2): 2 g via INTRAVENOUS
  Filled 2022-10-29 (×2): qty 100

## 2022-10-29 MED ORDER — SULFAMETHOXAZOLE-TRIMETHOPRIM 800-160 MG PO TABS: 1.0000 | ORAL_TABLET | Freq: Two times a day (BID) | ORAL | 7 refills | Status: AC

## 2022-10-29 MED ORDER — OXYCODONE HCL 5 MG PO TABS
5.0000 mg | ORAL_TABLET | Freq: Once | ORAL | Status: AC | PRN
  Administered 2022-10-29: 5 mg via ORAL

## 2022-10-29 MED ORDER — SODIUM CHLORIDE 0.9% FLUSH
3.0000 mL | Freq: Two times a day (BID) | INTRAVENOUS | Status: DC
  Administered 2022-10-29 – 2022-10-30 (×3): 3 mL via INTRAVENOUS

## 2022-10-29 SURGICAL SUPPLY — 45 items
BAG COUNTER SPONGE SURGICOUNT (BAG) IMPLANT
BAG URINE DRAIN 2000ML AR STRL (UROLOGICAL SUPPLIES) ×2 IMPLANT
BAG URO CATCHER STRL LF (MISCELLANEOUS) ×2 IMPLANT
BLADE HEX COATED 2.75 (ELECTRODE) ×2 IMPLANT
BNDG GAUZE DERMACEA FLUFF 4 (GAUZE/BANDAGES/DRESSINGS) ×2 IMPLANT
CATH FOLEY 2WAY 5CC 16FR (CATHETERS)
CATH URET 5FR 28IN OPEN ENDED (CATHETERS) IMPLANT
CATH URETHERAL OPEN END 7FR (CATHETERS) IMPLANT
CATH URTH STD 16FR FL 2W DRN (CATHETERS) IMPLANT
COVER SURGICAL LIGHT HANDLE (MISCELLANEOUS) ×2 IMPLANT
DRAPE LAPAROSCOPIC ABDOMINAL (DRAPES) IMPLANT
ELECT REM PT RETURN 15FT ADLT (MISCELLANEOUS) ×2 IMPLANT
GAUZE SPONGE 4X4 12PLY STRL (GAUZE/BANDAGES/DRESSINGS) IMPLANT
GLOVE BIO SURGEON STRL SZ7.5 (GLOVE) ×2 IMPLANT
GLOVE BIOGEL M STRL SZ7.5 (GLOVE) ×2 IMPLANT
GLOVE BIOGEL PI IND STRL 8 (GLOVE) IMPLANT
GOWN STRL REUS W/ TWL LRG LVL3 (GOWN DISPOSABLE) ×2 IMPLANT
GOWN STRL REUS W/ TWL XL LVL3 (GOWN DISPOSABLE) ×2 IMPLANT
GOWN STRL REUS W/TWL LRG LVL3 (GOWN DISPOSABLE) ×2
GOWN STRL REUS W/TWL XL LVL3 (GOWN DISPOSABLE) ×2
GUIDEWIRE ANG ZIPWIRE 038X150 (WIRE) IMPLANT
GUIDEWIRE STR DUAL SENSOR (WIRE) IMPLANT
IV NS IRRIG 3000ML ARTHROMATIC (IV SOLUTION) ×4 IMPLANT
KIT BASIN OR (CUSTOM PROCEDURE TRAY) ×2 IMPLANT
KIT TURNOVER KIT B (KITS) ×2 IMPLANT
MANIFOLD NEPTUNE II (INSTRUMENTS) ×2 IMPLANT
NEEDLE HYPO 22GX1.5 SAFETY (NEEDLE) IMPLANT
NS IRRIG 1000ML POUR BTL (IV SOLUTION) ×2 IMPLANT
PACK CYSTO (CUSTOM PROCEDURE TRAY) ×2 IMPLANT
PACK GENERAL/GYN (CUSTOM PROCEDURE TRAY) ×2 IMPLANT
SPONGE T-LAP 18X18 ~~LOC~~+RFID (SPONGE) IMPLANT
STENT CONTOUR 6FRX24X.038 (STENTS) IMPLANT
STENT CONTOUR 6FRX26X.038 (STENTS) IMPLANT
SUPPORT SCROTAL LG STRP (MISCELLANEOUS) ×2 IMPLANT
SUT CHROMIC 3 0 SH 27 (SUTURE) ×4 IMPLANT
SUT CHROMIC 4 0 RB 1X27 (SUTURE) IMPLANT
SUT PDS AB 3-0 SH 27 (SUTURE) IMPLANT
SUT VIC AB 2-0 UR5 27 (SUTURE) IMPLANT
SUT VIC AB 3-0 SH 27 (SUTURE) ×4
SUT VIC AB 3-0 SH 27X BRD (SUTURE) IMPLANT
SUT VICRYL 0 TIES 12 18 (SUTURE) IMPLANT
SYR CONTROL 10ML LL (SYRINGE) IMPLANT
TOWEL GREEN STERILE (TOWEL DISPOSABLE) ×4 IMPLANT
TUBE CONNECTING 12X1/4 (SUCTIONS) IMPLANT
WATER STERILE IRR 1000ML POUR (IV SOLUTION) ×2 IMPLANT
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
## 2022-10-29 NOTE — ED Notes (Signed)
Patients mother Rogelio Seen [phone], patients friend [phone]
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
## 2022-10-29 NOTE — Progress Notes (Signed)
Orthopedic Tech Progress Note Patient Details:  Rodney Baker [birth year] [id number]  Patient ID: Rodney Baker., male   DOB: [birth year], 27 y.o.   MRN: [id number] I attended trauma page. Trinna Post 10/29/2022, 4:24 AM
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
## 2022-10-29 NOTE — Anesthesia Procedure Notes (Signed)
Procedure Name: Intubation Date/Time: 10/29/2022 3:44 AM  Performed by: Laruth Bouchard., CRNAPre-anesthesia Checklist: Patient identified, Emergency Drugs available, Suction available, Patient being monitored and Timeout performed Patient Re-evaluated:Patient Re-evaluated prior to induction Oxygen Delivery Method: Circle system utilized Preoxygenation: Pre-oxygenation with 100% oxygen Induction Type: IV induction, Rapid sequence and Cricoid Pressure applied Laryngoscope Size: Mac and 4 Grade View: Grade II Tube type: Oral Tube size: 7.5 mm Number of attempts: 1 Airway Equipment and Method: Stylet Placement Confirmation: ETT inserted through vocal cords under direct vision, positive ETCO2 and breath sounds checked- equal and bilateral Secured at: 22 cm Tube secured with: Tape Dental Injury: Teeth and Oropharynx as per pre-operative assessment

## 2022-10-29 NOTE — ED Notes (Signed)
Patient to CT.

## 2022-10-29 NOTE — Progress Notes (Signed)
When patient awoke from surgery he was anxious and repeatedly asked for a telephone to hold. This nurse used the Vocera phone to help him call his girlfriend, we also attempted to call his mother but her phone went to voicemail. Afterwards he asked for a phone of his own to make calls but this nurse notified him that we do not have patient phones in PACU but that he would have one when he got to his room. The patient was unhappy about this. When the patient was told that he would be staying the night in the hospital he got even more anxious and started yelling that he was told otherwise before surgery, and that he was angry about not having a phone of his own. He became very agitated and said he would pull all his cords off and just leave. He "wanted his people".This nurse called the patient's girlfriend again and had her come back to the PACU to attempt to calm him down, which did seem to work. He is currently calm and cooperative, we are waiting for a room assignment for him. Will continue to monitor.

## 2022-10-29 NOTE — H&P (Signed)
See same-day Consult Note.

## 2022-10-29 NOTE — Discharge Instructions (Addendum)
Surgical [facility] Care  Surgical drains are placed during surgery. They are used to get rid of the extra fluid that can build up in a wound after surgery. They can also help heal the wound.  You have a Passive drain. This allows fluid to drain using gravity rather than suction. Drainage flows through a tube to a bandage (dressing) outside of the body. These drains do not need to be emptied. You have a common type of passive drain = Penrose drain. Right after surgery, drainage is often bright red and a little thicker than water. It may turn yellow or pink over time and become thinner. Your health care provider may remove the drain when the drainage stops or when the amount decreases to 1-2 tbsp (15-30 mL) in a 24-hour period. How to care for your surgical drain Care for your drain as told by your provider. Keep the skin around the drain dry and covered with a dressing at all times. This can help to prevent infection.  Changing the dressings Follow instructions from your provider about how to change your dressing. Change it at least once a day or as needed. Change it more often if needed to keep the dressing dry. Make sure you: Gather your supplies. Gauze square: 4 x 4 inches (10 x 10 cm). Wash your hands with soap and water for at least 20 seconds before and after you change your dressing. If soap and water are not available, use hand sanitizer. Remove the old bloody gauze, place new dry clean gauze under the drain/scrotum in your underwear  Contact a health care provider if: You have any signs of infection around your drain area. You have a fever or chills. The amount of drainage that you have stops all of a sudden, or the drainage increases rather than decreases. Your tube falls out. Your active drain does not stay compressed after you empty it. The tube gets detached from the bulb or container. This information is not intended to replace advice given to you by your health care provider. Make  sure you discuss any questions you have with your health care provider.  Thigh wound care per [facility] Team: Dry dressing to thigh wounds - change as needed
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
## 2022-10-29 NOTE — ED Notes (Signed)
Wound care provided to wounds, wounds bandaged with non stick gauze.

## 2022-10-30 ENCOUNTER — Encounter (HOSPITAL_COMMUNITY): Payer: Self-pay | Admitting: Urology

## 2022-10-30 ENCOUNTER — Encounter (HOSPITAL_BASED_OUTPATIENT_CLINIC_OR_DEPARTMENT_OTHER): Payer: Self-pay | Admitting: Emergency Medicine

## 2022-10-30 DIAGNOSIS — S3121XA Laceration without foreign body of penis, initial encounter: Secondary | ICD-10-CM | POA: Diagnosis not present

## 2022-10-30 DIAGNOSIS — F17210 Nicotine dependence, cigarettes, uncomplicated: Secondary | ICD-10-CM | POA: Diagnosis not present

## 2022-10-30 DIAGNOSIS — S3130XA Unspecified open wound of scrotum and testes, initial encounter: Secondary | ICD-10-CM | POA: Diagnosis not present

## 2022-10-30 DIAGNOSIS — S71111A Laceration without foreign body, right thigh, initial encounter: Secondary | ICD-10-CM | POA: Diagnosis not present

## 2022-10-30 DIAGNOSIS — Z79899 Other long term (current) drug therapy: Secondary | ICD-10-CM | POA: Diagnosis not present

## 2022-10-30 MED ORDER — GABAPENTIN 300 MG PO CAPS
300.0000 mg | ORAL_CAPSULE | Freq: Three times a day (TID) | ORAL | Status: DC
  Administered 2022-10-30: 300 mg via ORAL
  Filled 2022-10-30: qty 1

## 2022-10-30 MED ORDER — KETOROLAC TROMETHAMINE 30 MG/ML IJ SOLN
30.0000 mg | Freq: Once | INTRAMUSCULAR | Status: AC | PRN
  Administered 2022-10-30: 30 mg via INTRAVENOUS
  Filled 2022-10-30: qty 1

## 2022-10-30 MED ORDER — METHOCARBAMOL 1000 MG/10ML IJ SOLN
500.0000 mg | Freq: Four times a day (QID) | INTRAVENOUS | Status: DC
  Filled 2022-10-30 (×6): qty 5

## 2022-10-30 MED ORDER — CHLORHEXIDINE GLUCONATE CLOTH 2 % EX PADS
6.0000 | MEDICATED_PAD | Freq: Every day | CUTANEOUS | Status: DC
  Administered 2022-10-30: 6 via TOPICAL

## 2022-10-30 MED ORDER — ACETAMINOPHEN 500 MG PO TABS
1000.0000 mg | ORAL_TABLET | Freq: Four times a day (QID) | ORAL | Status: DC
  Administered 2022-10-30 (×2): 1000 mg via ORAL
  Filled 2022-10-30 (×2): qty 2
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
## 2022-10-30 NOTE — Progress Notes (Signed)
Pt reporting new chest pain/heaviness on chest, heaviness in BUE, tightness in throat, noted to have beads of sweat on forehead. When asked, pt states their chest "just hurts", when given further descriptors pt states "yes" to shooting pain, heaviness and muscle soreness/aching across their chest. Provider paged and secure chat messages sent.  Per provider, order/complete EKG, no labs indicated at this time. Provider states they will be rounding on pt soon.  Vital signs are as follows:   10/30/22 0539  Vitals  Temp 98.5 F (36.9 C)  Temp Source Oral  BP (!) 142/92  MAP (mmHg) 103  BP Method Automatic  Pulse Rate (!) 57  Pulse Rate Source Monitor  Resp 16  MEWS COLOR  MEWS Score Color Green  Oxygen Therapy  SpO2 100 %

## 2022-10-30 NOTE — Plan of Care (Signed)
Problem: Education: Goal: Knowledge of General Education information will improve Description Including pain rating scale, medication(s)/side effects and non-pharmacologic comfort measures Outcome: Progressing   Problem: Health Behavior/Discharge Planning: Goal: Ability to manage health-related needs will improve Outcome: Progressing   Problem: Clinical Measurements: Goal: Will remain free from infection Outcome: Progressing Goal: Diagnostic test results will improve Outcome: Progressing   Problem: Activity: Goal: Risk for activity intolerance will decrease Outcome: Progressing   Problem: Nutrition: Goal: Adequate nutrition will be maintained Outcome: Progressing   Problem: Coping: Goal: Level of anxiety will decrease Outcome: Progressing   Problem: Elimination: Goal: Will not experience complications related to urinary retention Outcome: Progressing   Problem: Pain Managment: Goal: General experience of comfort will improve Outcome: Progressing   Problem: Safety: Goal: Ability to remain free from injury will improve Outcome: Progressing   Problem: Skin Integrity: Goal: Risk for impaired skin integrity will decrease Outcome: Progressing   

## 2022-10-30 NOTE — Progress Notes (Signed)
PT Cancellation Note  Patient Details Name: Rodney Baker. MRN: [id number] DOB: [birth year]   Cancelled Treatment:    Reason Eval/Treat Not Completed: Patient at procedure or test/unavailable.  Receiving care, retry another time.   Ivar Drape 10/30/2022, 10:56 AM  Samul Dada, PT PhD Acute Rehab Dept. Number: [facility] [id number] and [facility] [phone]

## 2022-10-30 NOTE — Progress Notes (Signed)
Pt deferring foley care throughout the night and at this current time due to ongoing pain.
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
## 2022-10-30 NOTE — Progress Notes (Signed)
Pt reporting worsening 10/10 pain in RLQ abdomen and at penis/scrotum surgical incision sites. Pain not relieved by pain medication, repositioning or standing.   Pt states the new abdominal pain started around 1900 when someone came to check the surgical sites and is getting worse. Reports pain medication hasn't helped at all.  Noted with some dried blood on dressings unchanged from start of shift; doesn't appear to be actively bleeding. Pt states the surgeon told him not to let the dressing come off until the morning, pt wont dressing to be removed nor site to be further assessed.   VSS. Provider paged, orders updated.

## 2022-10-30 NOTE — Anesthesia Postprocedure Evaluation (Signed)
Anesthesia Post Note  Patient: Rodney Baker.  Procedure(s) Performed: PENILE AND SCROTUM EXPLORATION CYSTOSCOPY     Patient location during evaluation: PACU Anesthesia Type: General Level of consciousness: awake Pain management: pain level controlled Vital Signs Assessment: post-procedure vital signs reviewed and stable Respiratory status: spontaneous breathing, nonlabored ventilation and respiratory function stable Cardiovascular status: blood pressure returned to baseline and stable Postop Assessment: no apparent nausea or vomiting Anesthetic complications: no   No notable events documented.  Last Vitals:  Vitals:   10/30/22 0546 10/30/22 0621  BP:  (!) 140/91  Pulse: 63 83  Resp:  16  Temp:    SpO2: 100% 100%    Last Pain:  Vitals:   10/30/22 0539  TempSrc: Oral  PainSc:                  Catheryn Bacon Yunique [place]

## 2022-11-03 ENCOUNTER — Encounter (HOSPITAL_COMMUNITY): Payer: Self-pay

## 2022-11-03 ENCOUNTER — Emergency Department (HOSPITAL_COMMUNITY): Payer: 59

## 2022-11-03 ENCOUNTER — Emergency Department (HOSPITAL_COMMUNITY)
Admission: EM | Admit: 2022-11-03 | Discharge: 2022-11-03 | Disposition: A | Discharge status: Home / Self Care | Payer: 59 | Attending: Emergency Medicine | Admitting: Emergency Medicine

## 2022-11-03 DIAGNOSIS — S71101D Unspecified open wound, right thigh, subsequent encounter: Secondary | ICD-10-CM | POA: Insufficient documentation

## 2022-11-03 DIAGNOSIS — S3130XD Unspecified open wound of scrotum and testes, subsequent encounter: Secondary | ICD-10-CM | POA: Diagnosis not present

## 2022-11-03 DIAGNOSIS — N499 Inflammatory disorder of unspecified male genital organ: Secondary | ICD-10-CM | POA: Diagnosis not present

## 2022-11-03 DIAGNOSIS — W3400XD Accidental discharge from unspecified firearms or gun, subsequent encounter: Secondary | ICD-10-CM | POA: Diagnosis not present

## 2022-11-03 DIAGNOSIS — S71131D Puncture wound without foreign body, right thigh, subsequent encounter: Secondary | ICD-10-CM

## 2022-11-03 DIAGNOSIS — S79921D Unspecified injury of right thigh, subsequent encounter: Secondary | ICD-10-CM | POA: Diagnosis not present

## 2022-11-03 DIAGNOSIS — N50811 Right testicular pain: Secondary | ICD-10-CM | POA: Diagnosis not present

## 2022-11-03 DIAGNOSIS — Z743 Need for continuous supervision: Secondary | ICD-10-CM | POA: Diagnosis not present

## 2022-11-03 DIAGNOSIS — S3133XD Puncture wound without foreign body of scrotum and testes, subsequent encounter: Secondary | ICD-10-CM

## 2022-11-03 HISTORY — DX: Accidental discharge from unspecified firearms or gun, initial encounter: W34.00XA

## 2022-11-03 LAB — URINALYSIS, W/ REFLEX TO CULTURE (INFECTION SUSPECTED)
Bacteria, UA: NONE SEEN
Bilirubin Urine: NEGATIVE
Glucose, UA: NEGATIVE mg/dL
Hgb urine dipstick: NEGATIVE
Ketones, ur: NEGATIVE mg/dL
Leukocytes,Ua: NEGATIVE
Nitrite: NEGATIVE
Protein, ur: NEGATIVE mg/dL
Specific Gravity, Urine: 1.017 (ref 1.005–1.030)
pH: 6 (ref 5.0–8.0)

## 2022-11-03 LAB — CBC WITH DIFFERENTIAL/PLATELET
Abs Immature Granulocytes: 0.01 10*3/uL (ref 0.00–0.07)
Basophils Absolute: 0 10*3/uL (ref 0.0–0.1)
Basophils Relative: 1 %
Eosinophils Absolute: 0.1 10*3/uL (ref 0.0–0.5)
Eosinophils Relative: 2 %
HCT: 35.1 % — ABNORMAL LOW (ref 39.0–52.0)
Hemoglobin: 12 g/dL — ABNORMAL LOW (ref 13.0–17.0)
Immature Granulocytes: 0 %
Lymphocytes Relative: 24 %
Lymphs Abs: 1.7 10*3/uL (ref 0.7–4.0)
MCH: 30.8 pg (ref 26.0–34.0)
MCHC: 34.2 g/dL (ref 30.0–36.0)
MCV: 90.2 fL (ref 80.0–100.0)
Monocytes Absolute: 0.5 10*3/uL (ref 0.1–1.0)
Monocytes Relative: 7 %
Neutro Abs: 4.7 10*3/uL (ref 1.7–7.7)
Neutrophils Relative %: 66 %
Platelets: 227 10*3/uL (ref 150–400)
RBC: 3.89 MIL/uL — ABNORMAL LOW (ref 4.22–5.81)
RDW: 12.1 % (ref 11.5–15.5)
WBC: 7 10*3/uL (ref 4.0–10.5)
nRBC: 0 % (ref 0.0–0.2)

## 2022-11-03 LAB — BASIC METABOLIC PANEL
Anion gap: 12 (ref 5–15)
BUN: 17 mg/dL (ref 6–20)
CO2: 25 mmol/L (ref 22–32)
Calcium: 9.5 mg/dL (ref 8.9–10.3)
Chloride: 98 mmol/L (ref 98–111)
Creatinine, Ser: 1.23 mg/dL (ref 0.61–1.24)
GFR, Estimated: >60 mL/min (ref >60)
Glucose, Bld: 98 mg/dL (ref 70–99)
Potassium: 4 mmol/L (ref 3.5–5.1)
Sodium: 135 mmol/L (ref 135–145)

## 2022-11-03 MED ORDER — ONDANSETRON HCL 4 MG/2ML IJ SOLN
4.0000 mg | Freq: Once | INTRAMUSCULAR | Status: AC
  Administered 2022-11-03: 4 mg via INTRAVENOUS
  Filled 2022-11-03: qty 2

## 2022-11-03 MED ORDER — MORPHINE SULFATE (PF) 4 MG/ML IV SOLN
4.0000 mg | Freq: Once | INTRAVENOUS | Status: AC
  Administered 2022-11-03: 4 mg via INTRAVENOUS
  Filled 2022-11-03: qty 1

## 2022-11-03 NOTE — ED Notes (Signed)
Pt refusing [place], stating that he cannot tolerate lifting up his penis and cannot tolerate ultrasound jelly on his testes.

## 2022-11-03 NOTE — ED Notes (Signed)
Dressing placed on PT's leg per provider instructions, pt states that urology wanted to leave scrotum and penis open to air and put bulky dressing in under ware to collect drainage.  MD notified
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
## 2022-11-03 NOTE — ED Notes (Signed)
Urology at bedside.

## 2022-11-03 NOTE — ED Notes (Signed)
Pt back from ultrasound, ultrasound unsuccessful. Pt was unable to position himself for the exam, states that pain was not the issue, states that anatomically it was not possible md notified

## 2022-11-03 NOTE — ED Notes (Signed)
Pt in bed, pt reports 5/10 pain, states that he doesn't need anything for pain at this time, urology provider states that he removed the drain and also the foley cath.  Pt has no requests at this time.

## 2022-11-03 NOTE — ED Notes (Signed)
Ultra sound at bedside exam in progress, pt states that his pain is under control and he doesn't need anything for pain at this time.

## 2022-11-04 ENCOUNTER — Other Ambulatory Visit (INDEPENDENT_AMBULATORY_CARE_PROVIDER_SITE_OTHER): Payer: Self-pay | Admitting: Urology

## 2022-11-06 ENCOUNTER — Other Ambulatory Visit (INDEPENDENT_AMBULATORY_CARE_PROVIDER_SITE_OTHER): Payer: Self-pay | Admitting: Urology

## 2022-11-08 ENCOUNTER — Ambulatory Visit: Payer: 59 | Admitting: Student

## 2022-11-15 ENCOUNTER — Ambulatory Visit: Payer: 59 | Attending: Physician Assistant

## 2022-11-17 ENCOUNTER — Other Ambulatory Visit: Payer: Self-pay

## 2022-11-17 ENCOUNTER — Ambulatory Visit (INDEPENDENT_AMBULATORY_CARE_PROVIDER_SITE_OTHER): Payer: 59 | Admitting: Student

## 2022-11-17 ENCOUNTER — Encounter: Payer: Self-pay | Admitting: Student

## 2022-11-17 VITALS — BP 119/73 | HR 81 | Temp 97.8°F | Ht 72.0 in | Wt 161.1 lb

## 2022-11-17 DIAGNOSIS — S3124XD Puncture wound with foreign body of penis, subsequent encounter: Secondary | ICD-10-CM

## 2022-11-17 DIAGNOSIS — W3400XA Accidental discharge from unspecified firearms or gun, initial encounter: Secondary | ICD-10-CM

## 2022-11-17 DIAGNOSIS — S39848A Other specified injuries of external genitals, initial encounter: Secondary | ICD-10-CM | POA: Diagnosis not present
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
## 2022-11-17 NOTE — Patient Instructions (Addendum)
Please stop using neosporin and bactrim  Tylenol 1,000 three times a day, ibuprofen 400-600 mg every 6 hour as needed for pain You can pick these up at over the counter  Please schedule follow up with urology  Jerilee Field [phone] [address] [place], [place], [place] [id number]

## 2022-11-21 ENCOUNTER — Ambulatory Visit: Payer: 59

## 2022-11-23 ENCOUNTER — Ambulatory Visit: Payer: 59

## 2022-11-28 ENCOUNTER — Ambulatory Visit: Payer: 59

## 2022-11-30 ENCOUNTER — Ambulatory Visit: Payer: 59
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
## 2022-12-08 NOTE — Progress Notes (Signed)
Internal Medicine Clinic Attending ? ?Case discussed with Dr. Liang  At the time of the visit.  We reviewed the resident?s history and exam and pertinent patient test results.  I agree with the assessment, diagnosis, and plan of care documented in the resident?s note. ? ?

## 2024-02-11 ENCOUNTER — Emergency Department (HOSPITAL_BASED_OUTPATIENT_CLINIC_OR_DEPARTMENT_OTHER): Admission: EM | Admit: 2024-02-11 | Discharge: 2024-02-11 | Disposition: A | Discharge status: Home / Self Care

## 2024-02-11 ENCOUNTER — Other Ambulatory Visit: Payer: Self-pay

## 2024-02-11 ENCOUNTER — Encounter (HOSPITAL_BASED_OUTPATIENT_CLINIC_OR_DEPARTMENT_OTHER): Payer: Self-pay

## 2024-02-11 DIAGNOSIS — Z711 Person with feared health complaint in whom no diagnosis is made: Secondary | ICD-10-CM | POA: Diagnosis not present

## 2024-02-11 DIAGNOSIS — Z202 Contact with and (suspected) exposure to infections with a predominantly sexual mode of transmission: Secondary | ICD-10-CM | POA: Insufficient documentation

## 2024-02-11 LAB — URINALYSIS, W/ REFLEX TO CULTURE (INFECTION SUSPECTED)
Bilirubin Urine: NEGATIVE
Glucose, UA: NEGATIVE mg/dL
Hgb urine dipstick: NEGATIVE
Ketones, ur: NEGATIVE mg/dL
Nitrite: NEGATIVE
Protein, ur: NEGATIVE mg/dL
RBC / HPF: NONE SEEN RBC/hpf (ref 0–5)
Specific Gravity, Urine: 1.015 (ref 1.005–1.030)
pH: 7 (ref 5.0–8.0)

## 2024-02-11 MED ORDER — METRONIDAZOLE 500 MG PO TABS
2000.0000 mg | ORAL_TABLET | Freq: Once | ORAL | Status: AC
  Administered 2024-02-11: 2000 mg via ORAL
  Filled 2024-02-11: qty 4

## 2024-02-11 NOTE — ED Provider Notes (Signed)
 [place] [facility] Provider Note   CSN: [id number] Arrival date & time: 02/11/24  2201    Patient presents with: Exposure to STD   Rodney D Allie Jr. is a 28 y.o. male here for STD check.  Partner tested positive for trichomonas.  He denies any current symptoms, no discharge, dysuria, scrotal pain, swelling   HPI     Prior to Admission medications   Medication Sig Start Date End Date Taking? Authorizing Provider  HYDROcodone -acetaminophen  (NORCO) 7.5-325 MG tablet Take 1 tablet by mouth every 6 (six) hours as needed for moderate pain. 10/29/22   Nieves Cough, MD  ibuprofen  (ADVIL ) 800 MG tablet Take 1 tablet (800 mg total) by mouth every 8 (eight) hours as needed. 11/10/18   Kinsinger, Herlene Righter, MD  sulfamethoxazole -trimethoprim  (BACTRIM  DS) 800-160 MG tablet Take 1 tablet by mouth 2 (two) times daily. 10/29/22   Nieves Cough, MD    Allergies: Patient has no known allergies.    Review of Systems  Constitutional: Negative.   Cardiovascular: Negative.   Gastrointestinal: Negative.   Genitourinary: Negative.   Musculoskeletal: Negative.   Skin: Negative.   All other systems reviewed and are negative.   Updated Vital Signs BP 124/70   Pulse 73   Temp 98.2 F (36.8 C)   Resp 20   Ht 6' (1.829 m)   Wt 74.8 kg   SpO2 99%   BMI 22.38 kg/m   Physical Exam Vitals and nursing note reviewed.  Constitutional:      General: He is not in acute distress.    Appearance: He is well-developed. He is not ill-appearing or diaphoretic.  HENT:     Head: Atraumatic.  Cardiovascular:     Rate and Rhythm: Normal rate.  Pulmonary:     Effort: Pulmonary effort is normal. No respiratory distress.  Abdominal:     General: There is no distension.  Genitourinary:    Comments: declines Musculoskeletal:        General: Normal range of motion.     Cervical back: Normal range of motion.  Skin:    General: Skin is warm and dry.   Neurological:     General: No focal deficit present.     Mental Status: He is alert.     Gait: Gait normal.     (all labs ordered are listed, but only abnormal results are displayed) Labs Reviewed  URINALYSIS, W/ REFLEX TO CULTURE (INFECTION SUSPECTED)  RPR  HIV ANTIBODY (ROUTINE TESTING W REFLEX)  GC/CHLAMYDIA PROBE AMP ([place]) NOT AT [facility]    EKG: None  Radiology: No results found.   Procedures   Medications Ordered in the ED  metroNIDAZOLE  (FLAGYL ) tablet 2,000 mg (2,000 mg Oral Given 02/11/24 459)    28 year old here for evaluation of exposure to trichomonas.  Partner tested positive.  He is currently asymptomatic.  Get blood work for HIV, syphilis.  Urine for GC and chlamydia.  Will treat as presumptive positive for trichomonas given his known exposure.  He will follow-up with remaining labs on MyChart.  He knows to abstain from intercourse until both partners have been treated.  The patient has been appropriately medically screened and/or stabilized in the ED. I have low suspicion for any other emergent medical condition which would require further screening, evaluation or treatment in the ED or require inpatient management.  Patient is hemodynamically stable and in no acute distress.  Patient able to ambulate in department prior to ED.  Evaluation  does not show acute pathology that would require ongoing or additional emergent interventions while in the emergency department or further inpatient treatment.  I have discussed the diagnosis with the patient and answered all questions.  Pain is been managed while in the emergency department and patient has no further complaints prior to discharge.  Patient is comfortable with plan discussed in room and is stable for discharge at this time.  I have discussed strict return precautions for returning to the emergency department.  Patient was encouraged to follow-up with PCP/specialist refer to at discharge.                                     Medical Decision Making Amount and/or Complexity of Data Reviewed Independent Historian: friend External Data Reviewed: labs and notes. Labs: ordered. Decision-making details documented in ED Course.  Risk OTC drugs. Prescription drug management. Decision regarding hospitalization. Diagnosis or treatment significantly limited by social determinants of health.        Final diagnoses:  Exposure to trichomonas  Concern about STD in male without diagnosis    ED Discharge Orders     None          Merrily Tegeler A, PA-C 02/11/24 2236    Gennaro Bouchard L, DO 02/12/24 1327

## 2024-02-11 NOTE — Discharge Instructions (Signed)
 We are treating as if you are positive for trichomonas.  We gave you antibiotics here today.  Do not drink alcohol for 48 hours after you were given this medication  Remaining STD workup will show up on MyChart.  You will be called if anything is positive.  Recommend abstaining from sexual intercourse until both partners are treated and for 1 week after treatment.  Return for new or worsening symptoms

## 2024-02-11 NOTE — ED Triage Notes (Signed)
 Significant other tested positive for trich.   Denies urinary sx.   Requesting eval.

## 2024-02-12 LAB — RPR: RPR Ser Ql: NONREACTIVE

## 2024-02-12 LAB — HIV ANTIBODY (ROUTINE TESTING W REFLEX): HIV Screen 4th Generation wRfx: NONREACTIVE

## 2024-02-13 LAB — GC/CHLAMYDIA PROBE AMP (~~LOC~~) NOT AT ARMC
Chlamydia: NEGATIVE
Comment: NEGATIVE
Comment: NORMAL
Neisseria Gonorrhea: NEGATIVE

## 2024-03-27 ENCOUNTER — Emergency Department (HOSPITAL_BASED_OUTPATIENT_CLINIC_OR_DEPARTMENT_OTHER)

## 2024-03-27 ENCOUNTER — Emergency Department (HOSPITAL_BASED_OUTPATIENT_CLINIC_OR_DEPARTMENT_OTHER)
Admission: EM | Admit: 2024-03-27 | Discharge: 2024-03-27 | Disposition: A | Discharge status: Home / Self Care | Attending: Emergency Medicine | Admitting: Emergency Medicine

## 2024-03-27 ENCOUNTER — Other Ambulatory Visit: Payer: Self-pay

## 2024-03-27 ENCOUNTER — Encounter (HOSPITAL_BASED_OUTPATIENT_CLINIC_OR_DEPARTMENT_OTHER): Payer: Self-pay

## 2024-03-27 DIAGNOSIS — M79674 Pain in right toe(s): Secondary | ICD-10-CM | POA: Insufficient documentation

## 2024-03-27 DIAGNOSIS — W228XXA Striking against or struck by other objects, initial encounter: Secondary | ICD-10-CM | POA: Insufficient documentation

## 2024-03-27 DIAGNOSIS — Y9302 Activity, running: Secondary | ICD-10-CM | POA: Insufficient documentation

## 2024-03-27 MED ORDER — KETOROLAC TROMETHAMINE 15 MG/ML IJ SOLN
15.0000 mg | Freq: Once | INTRAMUSCULAR | Status: AC
  Administered 2024-03-27: 15 mg via INTRAMUSCULAR
  Filled 2024-03-27: qty 1

## 2024-03-27 NOTE — ED Triage Notes (Signed)
 Pr reports that he hit his right foot on the dresser last night. States that he can't put weight on foot.

## 2024-03-27 NOTE — ED Provider Notes (Signed)
 [place] [facility] Provider Note   CSN: [id number] Arrival date & time: 03/27/24  9080     Patient presents with: No chief complaint on file.   Rodney D Urista Jr. is a 28 y.o. male.   28 year old male no relevant past medical history resents emergency department with toe pain.  Patient reports he was running last night throughout the house playing with his daughters when he struck his right 4th and 5th toes on something that was sticking out.  Said that overnight the pain started getting worse and he is having difficulty bearing weight on his foot.  Has not tried any pain medication for it yet.       Prior to Admission medications   Medication Sig Start Date End Date Taking? Authorizing Provider  HYDROcodone -acetaminophen  (NORCO) 7.5-325 MG tablet Take 1 tablet by mouth every 6 (six) hours as needed for moderate pain. 10/29/22   Nieves Cough, MD  ibuprofen  (ADVIL ) 800 MG tablet Take 1 tablet (800 mg total) by mouth every 8 (eight) hours as needed. 11/10/18   Kinsinger, Herlene Righter, MD  sulfamethoxazole -trimethoprim  (BACTRIM  DS) 800-160 MG tablet Take 1 tablet by mouth 2 (two) times daily. 10/29/22   Nieves Cough, MD    Allergies: Patient has no known allergies.    Review of Systems  Updated Vital Signs BP 117/85 (BP Location: Right Arm)   Pulse 80   Temp 98.3 F (36.8 C) (Oral)   Resp 18   Ht 6' (1.829 m)   Wt 72.6 kg   SpO2 99%   BMI 21.70 kg/m   Physical Exam Musculoskeletal:       Feet:  Feet:     Comments: No tenderness to palpation along medial and lateral malleolus of the right ankle.  Mild tenderness to palpation of distal right fifth metatarsal.  No tenderness palpation of the metatarsals or tarsals otherwise.  No other signs of trauma to the foot.  DP pulse 2+.  No obvious deformities to the toes.    (all labs ordered are listed, but only abnormal results are displayed) Labs Reviewed - No data to  display  EKG: None  Radiology: DG Foot Complete Right Result Date: 03/27/2024 CLINICAL DATA:  Right foot injury, pain EXAM: RIGHT FOOT COMPLETE - 3+ VIEW COMPARISON:  None Available. FINDINGS: There is no evidence of fracture or dislocation. There is no evidence of arthropathy or other focal bone abnormality. Soft tissues are unremarkable. IMPRESSION: Negative. Electronically Signed   By: Michaeline Blanch M.D.   On: 03/27/2024 10:21     Procedures   Medications Ordered in the ED  ketorolac  (TORADOL ) 15 MG/ML injection 15 mg (15 mg Intramuscular Given 03/27/24 0951)                                    Medical Decision Making Risk Prescription drug management.   28 year old male who presents to the emergency department toe pain after stubbing his toes  Initial Ddx:  Fracture, sprain, hematoma  MDM/Course:  Patient presents emergency department toe pain after stubbing his toes.  No obvious deformities on exam.  No subungual hematoma.  X-ray without fracture.  Suspect his symptoms were contusion.  Given a postop shoe instructions follow-up with podiatry or orthopedics if his symptoms do not improve after several weeks  This patient presents to the ED for concern of complaints listed in HPI, this involves an  extensive number of treatment options, and is a complaint that carries with it a high risk of complications and morbidity. Disposition including potential need for admission considered.   Dispo: DC Home. Return precautions discussed including, but not limited to, those listed in the AVS. Allowed pt time to ask questions which were answered fully prior to dc.  I independently reviewed the following imaging with scope of interpretation limited to determining acute life threatening conditions related to emergency care: Extremity x-ray(s) and agree with the radiologist interpretation with the following exceptions: none I have reviewed the patients home medications and made adjustments as  needed  Portions of this note were generated with Dragon dictation software. Dictation errors may occur despite best attempts at proofreading.     Final diagnoses:  Pain of toe of right foot    ED Discharge Orders     None          Yolande Lamar BROCKS, MD 03/27/24 1824

## 2024-03-27 NOTE — Discharge Instructions (Signed)
 You were seen for your toe injury in the emergency department.   At home, please wear the postoperative shoe that we have given you for comfort.  Ice your toes.  Use Tylenol  and ibuprofen  for pain.    Check your MyChart online for the results of any tests that had not resulted by the time you left the emergency department.   Follow-up with podiatry or sports medicine in 1 to 2 weeks if you are still having pain.    Thank you for visiting our Emergency Department. It was a pleasure taking care of you today.

## 2024-04-22 ENCOUNTER — Emergency Department (HOSPITAL_BASED_OUTPATIENT_CLINIC_OR_DEPARTMENT_OTHER)
Admission: EM | Admit: 2024-04-22 | Discharge: 2024-04-22 | Disposition: A | Discharge status: Home / Self Care | Attending: Emergency Medicine | Admitting: Emergency Medicine

## 2024-04-22 ENCOUNTER — Encounter (HOSPITAL_BASED_OUTPATIENT_CLINIC_OR_DEPARTMENT_OTHER): Payer: Self-pay | Admitting: Emergency Medicine

## 2024-04-22 ENCOUNTER — Other Ambulatory Visit: Payer: Self-pay

## 2024-04-22 DIAGNOSIS — Z202 Contact with and (suspected) exposure to infections with a predominantly sexual mode of transmission: Secondary | ICD-10-CM | POA: Insufficient documentation

## 2024-04-22 LAB — URINALYSIS, ROUTINE W REFLEX MICROSCOPIC
Bilirubin Urine: NEGATIVE
Glucose, UA: NEGATIVE mg/dL
Hgb urine dipstick: NEGATIVE
Ketones, ur: NEGATIVE mg/dL
Leukocytes,Ua: NEGATIVE
Nitrite: NEGATIVE
Protein, ur: NEGATIVE mg/dL
Specific Gravity, Urine: 1.025 (ref 1.005–1.030)
pH: 6 (ref 5.0–8.0)

## 2024-04-22 MED ORDER — METRONIDAZOLE 500 MG PO TABS
2000.0000 mg | ORAL_TABLET | Freq: Once | ORAL | Status: AC
  Administered 2024-04-22: 2000 mg via ORAL
  Filled 2024-04-22: qty 4

## 2024-04-22 NOTE — ED Provider Notes (Signed)
 [place] [facility] Provider Note   CSN: [id number] Arrival date & time: 04/22/24  1836     Patient presents with: Exposure to STD   Rodney D Liou Jr. is a 28 y.o. male here requesting treatment for trichomonas.  Significant other tested positive last week.  He states he was tested for STIs last month and was negative.  He is currently asymptomatic.  No abdominal pain, dysuria, hematuria, penile discharge.  Does not want HIV and syphilis testing as he does not want a blood draw.   HPI     Prior to Admission medications   Medication Sig Start Date End Date Taking? Authorizing Provider  HYDROcodone -acetaminophen  (NORCO) 7.5-325 MG tablet Take 1 tablet by mouth every 6 (six) hours as needed for moderate pain. 10/29/22   Nieves Cough, MD  ibuprofen  (ADVIL ) 800 MG tablet Take 1 tablet (800 mg total) by mouth every 8 (eight) hours as needed. 11/10/18   Kinsinger, Herlene Righter, MD  sulfamethoxazole -trimethoprim  (BACTRIM  DS) 800-160 MG tablet Take 1 tablet by mouth 2 (two) times daily. 10/29/22   Nieves Cough, MD    Allergies: Patient has no known allergies.    Review of Systems  All other systems reviewed and are negative.   Updated Vital Signs BP 125/89   Pulse (!) 103   Temp 98.9 F (37.2 C) (Oral)   Resp 15   Ht 6' (1.829 m)   Wt 72.6 kg   SpO2 99%   BMI 21.70 kg/m   Physical Exam Vitals and nursing note reviewed.  Constitutional:      General: He is not in acute distress.    Appearance: He is well-developed. He is not ill-appearing or diaphoretic.  HENT:     Head: Atraumatic.  Eyes:     Pupils: Pupils are equal, round, and reactive to light.  Cardiovascular:     Rate and Rhythm: Normal rate and regular rhythm.  Pulmonary:     Effort: Pulmonary effort is normal. No respiratory distress.  Abdominal:     General: There is no distension.     Palpations: Abdomen is soft.  Genitourinary:    Comments:  declined Musculoskeletal:        General: Normal range of motion.     Cervical back: Normal range of motion and neck supple.  Skin:    General: Skin is warm and dry.  Neurological:     General: No focal deficit present.     Mental Status: He is alert and oriented to person, place, and time.     (all labs ordered are listed, but only abnormal results are displayed) Labs Reviewed  URINALYSIS, ROUTINE W REFLEX MICROSCOPIC  GC/CHLAMYDIA PROBE AMP ([place]) NOT AT [facility]    EKG: None  Radiology: No results found.   Procedures   Medications Ordered in the ED  metroNIDAZOLE  (FLAGYL ) tablet 2,000 mg (has no administration in time range)    28 year old here for evaluation requesting STD screening.  Was exposed to significant other who tested positive for trichomonas last week.  He is currently asymptomatic.  He does not want blood draw at this time.  He is willing to give a urine sample.  Given exposure to trichomonas we will treat him with Flagyl  here.  No recent EtOH use.  Will have him follow-up with health department.  He was okay with giving us  a urine sample for urine GC and chlamydia.  The patient has been appropriately medically screened and/or stabilized in  the ED. I have low suspicion for any other emergent medical condition which would require further screening, evaluation or treatment in the ED or require inpatient management.  Patient is hemodynamically stable and in no acute distress.  Patient able to ambulate in department prior to ED.  Evaluation does not show acute pathology that would require ongoing or additional emergent interventions while in the emergency department or further inpatient treatment.  I have discussed the diagnosis with the patient and answered all questions.  Pain is been managed while in the emergency department and patient has no further complaints prior to discharge.  Patient is comfortable with plan discussed in room and is stable for discharge at  this time.  I have discussed strict return precautions for returning to the emergency department.  Patient was encouraged to follow-up with PCP/specialist refer to at discharge.                                   Medical Decision Making Amount and/or Complexity of Data Reviewed External Data Reviewed: labs and notes. Labs: ordered. Decision-making details documented in ED Course.  Risk Prescription drug management. Decision regarding hospitalization. Diagnosis or treatment significantly limited by social determinants of health.        Final diagnoses:  STD exposure  Exposure to trichomonas    ED Discharge Orders     None          Jozalynn Noyce A, PA-C 04/22/24 1946    Doretha Folks, MD 04/25/24 [phone]

## 2024-04-22 NOTE — Discharge Instructions (Addendum)
 It was a pleasure taking care of you here today  We are treating you for trichomonas with medications here in the emergency department.  This is a single dose medication.  Make sure to follow-up outpatient with the health department.  Return for any worsening symptoms
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
## 2024-07-04 ENCOUNTER — Emergency Department (HOSPITAL_BASED_OUTPATIENT_CLINIC_OR_DEPARTMENT_OTHER)
Admission: EM | Admit: 2024-07-04 | Discharge: 2024-07-04 | Disposition: A | Discharge status: Home / Self Care | Attending: Emergency Medicine | Admitting: Emergency Medicine

## 2024-07-04 ENCOUNTER — Encounter (HOSPITAL_BASED_OUTPATIENT_CLINIC_OR_DEPARTMENT_OTHER): Payer: Self-pay | Admitting: Emergency Medicine

## 2024-07-04 ENCOUNTER — Other Ambulatory Visit: Payer: Self-pay

## 2024-07-04 DIAGNOSIS — Z202 Contact with and (suspected) exposure to infections with a predominantly sexual mode of transmission: Secondary | ICD-10-CM | POA: Insufficient documentation

## 2024-07-04 LAB — URINALYSIS, ROUTINE W REFLEX MICROSCOPIC
Bilirubin Urine: NEGATIVE
Glucose, UA: NEGATIVE mg/dL
Hgb urine dipstick: NEGATIVE
Ketones, ur: NEGATIVE mg/dL
Leukocytes,Ua: NEGATIVE
Nitrite: NEGATIVE
Protein, ur: NEGATIVE mg/dL
Specific Gravity, Urine: 1.025 (ref 1.005–1.030)
pH: 6 (ref 5.0–8.0)

## 2024-07-04 MED ORDER — METRONIDAZOLE 500 MG PO TABS
2000.0000 mg | ORAL_TABLET | Freq: Once | ORAL | Status: AC
  Administered 2024-07-04: 2000 mg via ORAL
  Filled 2024-07-04: qty 4

## 2024-07-04 NOTE — Discharge Instructions (Addendum)
 Evaluation today was overall reassuring.  Please follow-up on MyChart for the results of your urinalysis.  Since you have had a positive exposure to trichomonas, you were treated here empirically with a one-time dose of metronidazole .  Please do not drink alcohol for the next 72 hours.  Please follow-up with your PCP.

## 2024-07-04 NOTE — ED Provider Notes (Signed)
" °  [place] [facility] Provider Note   CSN: [id number] Arrival date & time: 07/04/24  9075     Patient presents with: Exposure to STD  HPI Rodney Baker. is a 29 y.o. male presenting for exposure to STD.  States his partner was recently positive for trichomonas.  He denies any symptoms today.  Here for STD testing although he reports that he does have appoint with the PCP where he is going to have blood work drawn.  Requesting only urine sample for trichomonas.  Denies alcohol use in the last week.    Exposure to STD       Prior to Admission medications  Medication Sig Start Date End Date Taking? Authorizing Provider  HYDROcodone -acetaminophen  (NORCO) 7.5-325 MG tablet Take 1 tablet by mouth every 6 (six) hours as needed for moderate pain. 10/29/22   Nieves Cough, MD  ibuprofen  (ADVIL ) 800 MG tablet Take 1 tablet (800 mg total) by mouth every 8 (eight) hours as needed. 11/10/18   Kinsinger, Herlene Righter, MD  sulfamethoxazole -trimethoprim  (BACTRIM  DS) 800-160 MG tablet Take 1 tablet by mouth 2 (two) times daily. 10/29/22   Nieves Cough, MD    Allergies: Patient has no known allergies.    Review of Systems See HPI  Updated Vital Signs BP 133/88   Pulse 85   Temp 97.6 F (36.4 C)   Resp 16   Wt 72.6 kg   SpO2 90%   BMI 21.70 kg/m   Physical Exam Constitutional:      Appearance: Normal appearance.  HENT:     Head: Normocephalic.     Nose: Nose normal.  Eyes:     Conjunctiva/sclera: Conjunctivae normal.  Pulmonary:     Effort: Pulmonary effort is normal.  Neurological:     Mental Status: He is alert.  Psychiatric:        Mood and Affect: Mood normal.     (all labs ordered are listed, but only abnormal results are displayed) Labs Reviewed  URINALYSIS, ROUTINE W REFLEX MICROSCOPIC    EKG: None  Radiology: No results found.   Procedures   Medications Ordered in the ED  metroNIDAZOLE  (FLAGYL ) tablet 2,000 mg  (has no administration in time range)                                    Medical Decision Making Amount and/or Complexity of Data Reviewed Labs: ordered.   29 year old well-appearing male presenting for STD exposure.  Exam unremarkable.  Will go ahead and treat him empirically for trichomonas given he has had a positive exposure.  Urinalysis was sent.  Advised him to follow-up with his PCP for reevaluation and to have blood work drawn for STD exposure at that time.  Advised him to follow-up on MyChart with his results.  Discharged.     Final diagnoses:  STD exposure    ED Discharge Orders     None          Lang Norleen POUR, PA-C 07/04/24 9051    Cottie Cough PARAS, MD 07/04/24 1034  "
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
## 2024-07-07 LAB — GC/CHLAMYDIA PROBE AMP (~~LOC~~) NOT AT ARMC
Chlamydia: NEGATIVE
Comment: NEGATIVE
Comment: NORMAL
Neisseria Gonorrhea: NEGATIVE
